# Patient Record
Sex: Female | Born: 1970 | Race: Black or African American | Hispanic: No | Marital: Single | Smoking: Current every day smoker
Health system: Southern US, Community
[De-identification: ages and names within clinical notes are randomized; demographics above are authoritative.]

## PROBLEM LIST (undated history)

## (undated) DIAGNOSIS — D571 Sickle-cell disease without crisis: Secondary | ICD-10-CM

## (undated) DIAGNOSIS — E119 Type 2 diabetes mellitus without complications: Secondary | ICD-10-CM

---

## 2016-12-04 ENCOUNTER — Encounter: Payer: Self-pay | Admitting: Emergency Medicine

## 2016-12-04 ENCOUNTER — Emergency Department
Admission: EM | Admit: 2016-12-04 | Discharge: 2016-12-04 | Disposition: A | Discharge status: Home / Self Care | Payer: Medicaid Other | Attending: Emergency Medicine | Admitting: Emergency Medicine

## 2016-12-04 DIAGNOSIS — L02411 Cutaneous abscess of right axilla: Secondary | ICD-10-CM

## 2016-12-04 DIAGNOSIS — Z4800 Encounter for change or removal of nonsurgical wound dressing: Secondary | ICD-10-CM | POA: Diagnosis not present

## 2016-12-04 DIAGNOSIS — E119 Type 2 diabetes mellitus without complications: Secondary | ICD-10-CM | POA: Diagnosis not present

## 2016-12-04 DIAGNOSIS — F17200 Nicotine dependence, unspecified, uncomplicated: Secondary | ICD-10-CM | POA: Insufficient documentation

## 2016-12-04 HISTORY — DX: Type 2 diabetes mellitus without complications: E11.9

## 2016-12-04 MED ORDER — CLINDAMYCIN HCL 300 MG PO CAPS: 300.0000 mg | ORAL_CAPSULE | Freq: Three times a day (TID) | ORAL | 0 refills | Status: AC

## 2016-12-04 MED ORDER — NAPROXEN 500 MG PO TABS: 500.0000 mg | ORAL_TABLET | Freq: Two times a day (BID) | ORAL | 0 refills | Status: AC

## 2016-12-04 NOTE — Discharge Instructions (Signed)
Follow up with [facility] for symptoms that are not improving over the next 3 days. Return to the ER for symptoms that change or worsen if unable to schedule an appointment.

## 2016-12-04 NOTE — ED Triage Notes (Signed)
States she had an abscess lanced under right arm  States area is still draining and painful

## 2016-12-04 NOTE — ED Provider Notes (Signed)
[facility] Emergency Department Provider Note  ____________________________________________  Time seen: Approximately 3:46 PM  I have reviewed the triage vital signs and the nursing notes.   HISTORY  Chief Complaint Abscess   HPI Diana Harvey is a 46 y.o. female who presents to the emergency department for evaluation of abscess under right arm. Symptoms started 2 weeks ago. I&D at another facility. She finished a 10 day course of antibiotics. Area remains painful and continues to drain. She had stopped taking her diabetes medication, which she contributes to the onset of abscess. She has since restarted the medication.   Past Medical History:  Diagnosis Date  . Diabetes mellitus without complication (HCC)     There are no active problems to display for this patient.   History reviewed. No pertinent surgical history.  Prior to Admission medications   Medication Sig Start Date End Date Taking? Authorizing Provider  clindamycin (CLEOCIN) 300 MG capsule Take 1 capsule (300 mg total) by mouth 3 (three) times daily. 12/04/16 12/14/16  Chinita Pesterari B Moo Gravley, FNP  naproxen (NAPROSYN) 500 MG tablet Take 1 tablet (500 mg total) by mouth 2 (two) times daily with a meal. 12/04/16   Chinita Pesterari B Issis Lindseth, FNP    Allergies Patient has no known allergies.  No family history on file.  Social History Social History  Substance Use Topics  . Smoking status: Current Every Day Smoker  . Smokeless tobacco: Never Used  . Alcohol use No    Review of Systems  Constitutional: negative for fever/chills Respiratory: negative for shortness of breath. Musculoskeletal: negative for pain. Skin: positive for abscess Neurological: Negative for headaches, focal weakness or numbness. ____________________________________________   PHYSICAL EXAM:  VITAL SIGNS: ED Triage Vitals [12/04/16 1538]  Enc Vitals Group     BP 109/70     Pulse Rate (!) 109     Resp 16     Temp 98 F (36.7  C)     Temp Source Oral     SpO2 94 %     Weight 220 lb (99.8 kg)     Height 5\' 7"  (1.702 m)     Head Circumference      Peak Flow      Pain Score      Pain Loc      Pain Edu?      Excl. in GC?      Constitutional: Alert and oriented. Well appearing and in no acute distress. Eyes: Conjunctivae are normal. EOMI. Nose: No congestion/rhinnorhea. Mouth/Throat: Mucous membranes are moist.   Neck: No stridor. Lymphatic: No cervical lymphadenopathy. Cardiovascular: Good peripheral circulation. Respiratory: Normal respiratory effort.  No retractions. Lungs clear to auscultation. Musculoskeletal: FROM throughout. Neurologic:  Normal speech and language. No gross focal neurologic deficits are appreciated. Skin: Right axilla with tegaderm and iodiform gauze in place from I&D about 10 days ago. Skin appears without induration or erythema. After removal of packing, return of bright red blood noted without purulent drainage. No fluctuance noted. No lymphangitis.  ____________________________________________   LABS (all labs ordered are listed, but only abnormal results are displayed)  Labs Reviewed - No data to display ____________________________________________  EKG   ____________________________________________  RADIOLOGY   ____________________________________________   PROCEDURES  Procedure(s) performed: Packing removal from right axilla. ____________________________________________   INITIAL IMPRESSION / ASSESSMENT AND PLAN / ED COURSE     Pertinent labs & imaging results that were available during my care of the patient were reviewed by me and considered in my medical  decision making (see chart for details).  46 year old female presenting to the emergency department for evaluation of abscess to the right axilla that was I&d 10 days ago. While here, the packing was removed. No induration or fluctuance present after packing was removed. She will be treated with  clindamycin as she states that the pain continues. She was instructed to follow-up with her primary care provider for symptoms that are not improving over the next 2-3 days. She was instructed to return to the emergency department for symptoms that change or worsen, specifically lymphangitis.  ____________________________________________   FINAL CLINICAL IMPRESSION(S) / ED DIAGNOSES  Final diagnoses:  Abscess of axilla, right    New Prescriptions   CLINDAMYCIN (CLEOCIN) 300 MG CAPSULE    Take 1 capsule (300 mg total) by mouth 3 (three) times daily.   NAPROXEN (NAPROSYN) 500 MG TABLET    Take 1 tablet (500 mg total) by mouth 2 (two) times daily with a meal.    Note:  This document was prepared using Dragon voice recognition software and may include unintentional dictation errors.    Chinita Pester, FNP 12/04/16 1608    Emily Filbert, MD 12/04/16 [phone]

## 2017-03-26 ENCOUNTER — Other Ambulatory Visit: Payer: Self-pay | Admitting: Family Medicine

## 2017-03-27 ENCOUNTER — Other Ambulatory Visit: Payer: Self-pay | Admitting: Family Medicine

## 2017-03-27 DIAGNOSIS — Z1231 Encounter for screening mammogram for malignant neoplasm of breast: Secondary | ICD-10-CM

## 2017-03-30 ENCOUNTER — Other Ambulatory Visit: Payer: Self-pay | Admitting: Family Medicine

## 2017-03-30 DIAGNOSIS — Z1231 Encounter for screening mammogram for malignant neoplasm of breast: Secondary | ICD-10-CM

## 2017-04-10 ENCOUNTER — Ambulatory Visit
Admission: RE | Admit: 2017-04-10 | Discharge: 2017-04-10 | Disposition: A | Discharge status: Home / Self Care | Payer: Medicaid Other | Source: Ambulatory Visit | Attending: Family Medicine | Admitting: Family Medicine

## 2017-04-10 DIAGNOSIS — Z1231 Encounter for screening mammogram for malignant neoplasm of breast: Secondary | ICD-10-CM | POA: Diagnosis not present

## 2018-11-30 ENCOUNTER — Emergency Department: Payer: Medicaid Other

## 2018-11-30 ENCOUNTER — Encounter: Payer: Self-pay | Admitting: Emergency Medicine

## 2018-11-30 ENCOUNTER — Inpatient Hospital Stay
Admission: EM | Admit: 2018-11-30 | Discharge: 2018-12-03 | DRG: 163 | Disposition: A | Discharge status: Home / Self Care | Payer: Medicaid Other | Attending: Specialist | Admitting: Specialist

## 2018-11-30 ENCOUNTER — Other Ambulatory Visit (INDEPENDENT_AMBULATORY_CARE_PROVIDER_SITE_OTHER): Payer: Self-pay | Admitting: Vascular Surgery

## 2018-11-30 ENCOUNTER — Other Ambulatory Visit: Payer: Self-pay

## 2018-11-30 DIAGNOSIS — T40605A Adverse effect of unspecified narcotics, initial encounter: Secondary | ICD-10-CM | POA: Diagnosis not present

## 2018-11-30 DIAGNOSIS — I82413 Acute embolism and thrombosis of femoral vein, bilateral: Secondary | ICD-10-CM | POA: Diagnosis present

## 2018-11-30 DIAGNOSIS — D571 Sickle-cell disease without crisis: Secondary | ICD-10-CM | POA: Diagnosis present

## 2018-11-30 DIAGNOSIS — Z7984 Long term (current) use of oral hypoglycemic drugs: Secondary | ICD-10-CM | POA: Diagnosis not present

## 2018-11-30 DIAGNOSIS — Z23 Encounter for immunization: Secondary | ICD-10-CM | POA: Diagnosis not present

## 2018-11-30 DIAGNOSIS — E785 Hyperlipidemia, unspecified: Secondary | ICD-10-CM | POA: Diagnosis present

## 2018-11-30 DIAGNOSIS — Z7982 Long term (current) use of aspirin: Secondary | ICD-10-CM

## 2018-11-30 DIAGNOSIS — I519 Heart disease, unspecified: Secondary | ICD-10-CM | POA: Diagnosis not present

## 2018-11-30 DIAGNOSIS — F17200 Nicotine dependence, unspecified, uncomplicated: Secondary | ICD-10-CM | POA: Diagnosis present

## 2018-11-30 DIAGNOSIS — R079 Chest pain, unspecified: Secondary | ICD-10-CM | POA: Diagnosis present

## 2018-11-30 DIAGNOSIS — E119 Type 2 diabetes mellitus without complications: Secondary | ICD-10-CM

## 2018-11-30 DIAGNOSIS — F329 Major depressive disorder, single episode, unspecified: Secondary | ICD-10-CM | POA: Diagnosis present

## 2018-11-30 DIAGNOSIS — G92 Toxic encephalopathy: Secondary | ICD-10-CM | POA: Diagnosis not present

## 2018-11-30 DIAGNOSIS — E876 Hypokalemia: Secondary | ICD-10-CM | POA: Diagnosis present

## 2018-11-30 DIAGNOSIS — D649 Anemia, unspecified: Secondary | ICD-10-CM | POA: Diagnosis not present

## 2018-11-30 DIAGNOSIS — I2699 Other pulmonary embolism without acute cor pulmonale: Secondary | ICD-10-CM | POA: Diagnosis present

## 2018-11-30 DIAGNOSIS — R0902 Hypoxemia: Secondary | ICD-10-CM | POA: Diagnosis not present

## 2018-11-30 DIAGNOSIS — R5081 Fever presenting with conditions classified elsewhere: Secondary | ICD-10-CM | POA: Diagnosis not present

## 2018-11-30 DIAGNOSIS — Z9889 Other specified postprocedural states: Secondary | ICD-10-CM | POA: Diagnosis not present

## 2018-11-30 HISTORY — DX: Sickle-cell disease without crisis: D57.1

## 2018-11-30 LAB — INFLUENZA PANEL BY PCR (TYPE A & B)
Influenza A By PCR: NEGATIVE
Influenza B By PCR: NEGATIVE

## 2018-11-30 LAB — CBC
HEMATOCRIT: 41.9 % (ref 36.0–46.0)
Hemoglobin: 13.7 g/dL (ref 12.0–15.0)
MCH: 27.9 pg (ref 26.0–34.0)
MCHC: 32.7 g/dL (ref 30.0–36.0)
MCV: 85.3 fL (ref 80.0–100.0)
Platelets: 218 10*3/uL (ref 150–400)
RBC: 4.91 MIL/uL (ref 3.87–5.11)
RDW: 12.8 % (ref 11.5–15.5)
WBC: 13.5 10*3/uL — AB (ref 4.0–10.5)
nRBC: 0 % (ref 0.0–0.2)

## 2018-11-30 LAB — BASIC METABOLIC PANEL
Anion gap: 10 (ref 5–15)
BUN: 11 mg/dL (ref 6–20)
CHLORIDE: 97 mmol/L — AB (ref 98–111)
CO2: 29 mmol/L (ref 22–32)
Calcium: 9.5 mg/dL (ref 8.9–10.3)
Creatinine, Ser: 0.93 mg/dL (ref 0.44–1.00)
GFR calc Af Amer: >60 mL/min (ref >60)
GFR calc non Af Amer: >60 mL/min (ref >60)
Glucose, Bld: 164 mg/dL — ABNORMAL HIGH (ref 70–99)
Potassium: 3.1 mmol/L — ABNORMAL LOW (ref 3.5–5.1)
SODIUM: 136 mmol/L (ref 135–145)

## 2018-11-30 LAB — HEPATIC FUNCTION PANEL
ALBUMIN: 4.2 g/dL (ref 3.5–5.0)
ALK PHOS: 70 U/L (ref 38–126)
ALT: 10 U/L (ref 0–44)
AST: 12 U/L — ABNORMAL LOW (ref 15–41)
BILIRUBIN INDIRECT: 0.7 mg/dL (ref 0.3–0.9)
Bilirubin, Direct: 0.1 mg/dL (ref 0.0–0.2)
TOTAL PROTEIN: 8.6 g/dL — AB (ref 6.5–8.1)
Total Bilirubin: 0.8 mg/dL (ref 0.3–1.2)

## 2018-11-30 LAB — TROPONIN I: Troponin I: <0.03 ng/mL (ref <0.03)

## 2018-11-30 LAB — GLUCOSE, CAPILLARY: GLUCOSE-CAPILLARY: 159 mg/dL — AB (ref 70–99)

## 2018-11-30 LAB — PROTIME-INR
INR: 1.06
PROTHROMBIN TIME: 13.7 s (ref 11.4–15.2)

## 2018-11-30 LAB — HEPARIN LEVEL (UNFRACTIONATED): Heparin Unfractionated: 0.71 IU/mL — ABNORMAL HIGH (ref 0.30–0.70)

## 2018-11-30 LAB — LIPASE, BLOOD: Lipase: 27 U/L (ref 11–51)

## 2018-11-30 LAB — APTT: aPTT: 24 seconds (ref 24–36)

## 2018-11-30 MED ORDER — POLYETHYLENE GLYCOL 3350 17 G PO PACK
17.0000 g | PACK | Freq: Every day | ORAL | Status: DC | PRN
  Administered 2018-12-02: 17 g via ORAL
  Filled 2018-11-30: qty 1

## 2018-11-30 MED ORDER — DULOXETINE HCL 30 MG PO CPEP
60.0000 mg | ORAL_CAPSULE | Freq: Every evening | ORAL | Status: DC
  Administered 2018-11-30 – 2018-12-02 (×3): 60 mg via ORAL
  Filled 2018-11-30: qty 2
  Filled 2018-11-30 (×2): qty 1

## 2018-11-30 MED ORDER — OXYCODONE HCL 5 MG PO TABS
5.0000 mg | ORAL_TABLET | ORAL | Status: DC | PRN
  Administered 2018-12-01 – 2018-12-02 (×4): 5 mg via ORAL
  Filled 2018-11-30 (×4): qty 1

## 2018-11-30 MED ORDER — INSULIN ASPART 100 UNIT/ML ~~LOC~~ SOLN
0.0000 [IU] | Freq: Three times a day (TID) | SUBCUTANEOUS | Status: DC
  Administered 2018-12-01: 1 [IU] via SUBCUTANEOUS
  Administered 2018-12-02: 3 [IU] via SUBCUTANEOUS
  Administered 2018-12-02 (×2): 2 [IU] via SUBCUTANEOUS
  Administered 2018-12-03: 1 [IU] via SUBCUTANEOUS
  Administered 2018-12-03: 2 [IU] via SUBCUTANEOUS
  Administered 2018-12-03: 3 [IU] via SUBCUTANEOUS
  Filled 2018-11-30 (×7): qty 1

## 2018-11-30 MED ORDER — INFLUENZA VAC SPLIT QUAD 0.5 ML IM SUSY
0.5000 mL | PREFILLED_SYRINGE | INTRAMUSCULAR | Status: AC
  Administered 2018-12-03: 0.5 mL via INTRAMUSCULAR
  Filled 2018-11-30: qty 0.5

## 2018-11-30 MED ORDER — ZIPRASIDONE HCL 80 MG PO CAPS
80.0000 mg | ORAL_CAPSULE | Freq: Every day | ORAL | Status: DC
  Administered 2018-11-30 – 2018-12-02 (×3): 80 mg via ORAL
  Filled 2018-11-30 (×4): qty 1

## 2018-11-30 MED ORDER — KETOROLAC TROMETHAMINE 30 MG/ML IJ SOLN
30.0000 mg | Freq: Four times a day (QID) | INTRAMUSCULAR | Status: DC | PRN
  Administered 2018-11-30 – 2018-12-01 (×2): 30 mg via INTRAVENOUS
  Filled 2018-11-30 (×3): qty 1

## 2018-11-30 MED ORDER — ONDANSETRON HCL 4 MG/2ML IJ SOLN: 4.0000 mg | Freq: Four times a day (QID) | INTRAMUSCULAR | Status: DC | PRN

## 2018-11-30 MED ORDER — SODIUM CHLORIDE 0.9% FLUSH: 3.0000 mL | Freq: Once | INTRAVENOUS | Status: DC

## 2018-11-30 MED ORDER — ACETAMINOPHEN 325 MG PO TABS
650.0000 mg | ORAL_TABLET | Freq: Four times a day (QID) | ORAL | Status: DC | PRN
  Administered 2018-12-02: 650 mg via ORAL
  Filled 2018-11-30: qty 2

## 2018-11-30 MED ORDER — ZOLPIDEM TARTRATE 5 MG PO TABS
5.0000 mg | ORAL_TABLET | Freq: Every evening | ORAL | Status: DC | PRN
  Administered 2018-11-30 – 2018-12-02 (×3): 5 mg via ORAL
  Filled 2018-11-30 (×3): qty 1

## 2018-11-30 MED ORDER — IOHEXOL 350 MG/ML SOLN
75.0000 mL | Freq: Once | INTRAVENOUS | Status: AC | PRN
  Administered 2018-11-30: 75 mL via INTRAVENOUS

## 2018-11-30 MED ORDER — PNEUMOCOCCAL VAC POLYVALENT 25 MCG/0.5ML IJ INJ
0.5000 mL | INJECTION | INTRAMUSCULAR | Status: AC
  Administered 2018-12-03: 0.5 mL via INTRAMUSCULAR
  Filled 2018-11-30: qty 0.5

## 2018-11-30 MED ORDER — SODIUM CHLORIDE 0.9 % IV SOLN
INTRAVENOUS | Status: DC
  Administered 2018-12-01 (×2): via INTRAVENOUS

## 2018-11-30 MED ORDER — HEPARIN (PORCINE) 25000 UT/250ML-% IV SOLN
1200.0000 [IU]/h | INTRAVENOUS | Status: DC
  Administered 2018-11-30: 1300 [IU]/h via INTRAVENOUS
  Administered 2018-12-01: 1200 [IU]/h via INTRAVENOUS
  Filled 2018-11-30 (×2): qty 250

## 2018-11-30 MED ORDER — HEPARIN BOLUS VIA INFUSION
4700.0000 [IU] | Freq: Once | INTRAVENOUS | Status: AC
  Administered 2018-11-30: 4700 [IU] via INTRAVENOUS
  Filled 2018-11-30: qty 4700

## 2018-11-30 MED ORDER — FENTANYL CITRATE (PF) 100 MCG/2ML IJ SOLN
50.0000 ug | Freq: Once | INTRAMUSCULAR | Status: AC
  Administered 2018-11-30: 50 ug via INTRAVENOUS
  Filled 2018-11-30: qty 2

## 2018-11-30 MED ORDER — ACETAMINOPHEN 650 MG RE SUPP: 650.0000 mg | Freq: Four times a day (QID) | RECTAL | Status: DC | PRN

## 2018-11-30 MED ORDER — CEFAZOLIN SODIUM-DEXTROSE 2-4 GM/100ML-% IV SOLN
2.0000 g | Freq: Once | INTRAVENOUS | Status: AC
  Administered 2018-12-01: 2 g via INTRAVENOUS
  Filled 2018-11-30: qty 100

## 2018-11-30 MED ORDER — SIMVASTATIN 10 MG PO TABS
40.0000 mg | ORAL_TABLET | Freq: Every day | ORAL | Status: DC
  Administered 2018-12-01: 40 mg via ORAL
  Filled 2018-11-30: qty 4

## 2018-11-30 MED ORDER — ONDANSETRON HCL 4 MG PO TABS: 4.0000 mg | ORAL_TABLET | Freq: Four times a day (QID) | ORAL | Status: DC | PRN

## 2018-11-30 NOTE — Consult Note (Signed)
ANTICOAGULATION CONSULT NOTE - Initial Consult  Pharmacy Consult for Heparin Infusion Indication: pulmonary embolus  No Known Allergies  Patient Measurements: Height: 5\' 7"  (170.2 cm) Weight: 180 lb 3.2 oz (81.7 kg) IBW/kg (Calculated) : 61.6 Heparin Dosing Weight: 78.8  Vital Signs: Temp: 98.1 F (36.7 C) (02/11 1935) Temp Source: Oral (02/11 1935) BP: 111/64 (02/11 1935) Pulse Rate: 86 (02/11 1935)  Labs: Recent Labs    11/30/18 1129 11/30/18 1950  HGB 13.7  --   HCT 41.9  --   PLT 218  --   APTT 24  --   LABPROT 13.7  --   INR 1.06  --   HEPARINUNFRC  --  0.71*  CREATININE 0.93  --   TROPONINI <0.03  --     Estimated Creatinine Clearance: 81.3 mL/min (by C-G formula based on SCr of 0.93 mg/dL).   Medical History: Past Medical History:  Diagnosis Date  . Diabetes mellitus without complication (HCC)   . Sickle cell anemia (HCC)     Medications:  Medications Prior to Admission  Medication Sig Dispense Refill Last Dose  . aspirin EC 81 MG tablet Take 81 mg by mouth daily.   7+ days at Unknown  . canagliflozin (INVOKANA) 100 MG TABS tablet Take 100 mg by mouth daily before breakfast.   11/30/2018 at 0800  . DULoxetine (CYMBALTA) 60 MG capsule Take 60 mg by mouth every evening.    11/29/2018 at 1900  . simvastatin (ZOCOR) 40 MG tablet Take 40 mg by mouth daily.   11/30/2018 at 0800  . ziprasidone (GEODON) 80 MG capsule Take 80 mg by mouth at bedtime.   11/29/2018 at 2000  . zolpidem (AMBIEN) 10 MG tablet Take 10 mg by mouth at bedtime as needed for sleep.   Unknown at PRN  . naproxen (NAPROSYN) 500 MG tablet Take 1 tablet (500 mg total) by mouth 2 (two) times daily with a meal. 30 tablet 0     Assessment: 48 yo female with confirmed PE.  Pharmacy has been consulted to dose heparin infusion.  Patient has no history of prior anticoagulation PTA.  Goal of Therapy:  Heparin level 0.3-0.7 units/ml Monitor platelets by anticoagulation protocol: Yes   Plan:  2/11 @  1950 Heparin level - 0.71 and slightly elevated above up limit of goal range. Will decrease rate to heparin 1200 units/hr Check heparin level every 6 hours until two consecutive therapeutic levels then daily and CBC daily while on heparin Continue to monitor H&H and platelets  Orinda Kenner, PharmD Clinical Pharmacist 11/30/2018,8:15 PM

## 2018-11-30 NOTE — Consult Note (Signed)
[facility] Vascular Consult Note  MRN : [id number]  Diana Harvey is a 48 y.o. (1971-03-05) female who presents with chief complaint of  Chief Complaint  Patient presents with  . Chest Pain   History of Present Illness:  The patient is a 48 year old female with a past medical history of sickle cell anemia, diabetes mellitus, active tobacco abuse who presented with increasing shortness of breath and chest pain for approximately 2 to 3 days.  The patient was initially seen at the [facility] clinic and after being examined was sent to the emergency room where she was found to be tachycardic and  tachypneic.   The patient denies any recent surgery or trauma.  Denies any prolonged sickness/immobility.  Denies any history of bleeding/clotting disorders.  Patient denies any bilateral lower extremity pain or swelling.  Patient denies being on any type of blood thinner.  Patient denies any past medical history of DVT or PE. Denies any fever, nausea vomiting.  During her work-up in the emergency department the patient was found to have large bilateral pulmonary emboli right larger when compared to the left without right heart strain on CT. suspected right femoral vein thrombosis with possible left femoral vein thrombosis.  An ultrasound conducted after the CT was notable for no evidence of deep venous thrombosis bilaterally.  Vascular surgery was consulted by Dr. Allena Katz for further recommendations.  Current Facility-Administered Medications  Medication Dose Route Frequency Provider Last Rate Last Dose  . heparin ADULT infusion 100 units/mL (25000 units/245mL sodium chloride 0.45%)  1,300 Units/hr Intravenous Continuous Little Ishikawa, RPH 13 mL/hr at 11/30/18 1356 1,300 Units/hr at 11/30/18 1356  . ketorolac (TORADOL) 30 MG/ML injection 30 mg  30 mg Intravenous Q6H PRN Enedina Finner, MD   30 mg at 11/30/18 1618  . oxyCODONE (Oxy IR/ROXICODONE) immediate release tablet 5  mg  5 mg Oral Q4H PRN Enedina Finner, MD       Current Outpatient Medications  Medication Sig Dispense Refill  . aspirin EC 81 MG tablet Take 81 mg by mouth daily.    . canagliflozin (INVOKANA) 100 MG TABS tablet Take 100 mg by mouth daily before breakfast.    . DULoxetine (CYMBALTA) 60 MG capsule Take 60 mg by mouth every evening.     . simvastatin (ZOCOR) 40 MG tablet Take 40 mg by mouth daily.    . ziprasidone (GEODON) 80 MG capsule Take 80 mg by mouth at bedtime.    Marland Kitchen zolpidem (AMBIEN) 10 MG tablet Take 10 mg by mouth at bedtime as needed for sleep.    . naproxen (NAPROSYN) 500 MG tablet Take 1 tablet (500 mg total) by mouth 2 (two) times daily with a meal. 30 tablet 0   Past Medical History:  Diagnosis Date  . Diabetes mellitus without complication (HCC)   . Sickle cell anemia (HCC)    History reviewed. No pertinent surgical history.  Social History Social History   Tobacco Use  . Smoking status: Current Every Day Smoker  . Smokeless tobacco: Never Used  Substance Use Topics  . Alcohol use: No  . Drug use: Not Currently   Family History No family history on file.  Patient denies any family history of peripheral artery disease, venous disease or bleeding/clotting disorder  No Known Allergies  REVIEW OF SYSTEMS (Negative unless checked)  Constitutional: [] Weight loss  [] Fever  [] Chills Cardiac: [x] Chest pain   [x] Chest pressure   [x] Palpitations   [x] Shortness of breath when laying flat   [  x]Shortness of breath at rest   [x] Shortness of breath with exertion. Vascular:  [] Pain in legs with walking   [] Pain in legs at rest   [] Pain in legs when laying flat   [] Claudication   [] Pain in feet when walking  [] Pain in feet at rest  [] Pain in feet when laying flat   [] History of DVT   [] Phlebitis   [] Swelling in legs   [] Varicose veins   [] Non-healing ulcers Pulmonary:   [] Uses home oxygen   [] Productive cough   [] Hemoptysis   [] Wheeze  [] COPD   [] Asthma Neurologic:  [] Dizziness   [] Blackouts   [] Seizures   [] History of stroke   [] History of TIA  [] Aphasia   [] Temporary blindness   [] Dysphagia   [] Weakness or numbness in arms   [] Weakness or numbness in legs Musculoskeletal:  [] Arthritis   [] Joint swelling   [] Joint pain   [] Low back pain Hematologic:  [] Easy bruising  [] Easy bleeding   [] Hypercoagulable state   [] Anemic  [] Hepatitis Gastrointestinal:  [] Blood in stool   [] Vomiting blood  [] Gastroesophageal reflux/heartburn   [] Difficulty swallowing. Genitourinary:  [] Chronic kidney disease   [] Difficult urination  [] Frequent urination  [] Burning with urination   [] Blood in urine Skin:  [] Rashes   [] Ulcers   [] Wounds Psychological:  [] History of anxiety   []  History of major depression.  Physical Examination  Vitals:   11/30/18 1327 11/30/18 1352 11/30/18 1530 11/30/18 1600  BP:  127/87 120/67 124/74  Pulse:  (!) 114 90   Resp:  20 (!) 36 (!) 30  Temp:      TempSrc:      SpO2:  (!) 84% 98%   Weight: 83 kg     Height: 5\' 7"  (1.702 m)      Body mass index is 28.66 kg/m. Gen:  WD/WN, NAD Head: [place]/AT, No temporalis wasting. Prominent temp pulse not noted. Ear/Nose/Throat: Hearing grossly intact, nares w/o erythema or drainage, oropharynx w/o Erythema/Exudate Eyes: Sclera non-icteric, conjunctiva clear Neck: Trachea midline.  No JVD.  Pulmonary:  Good air movement, respirations not labored, equal bilaterally.  Cardiac: RRR, normal S1, S2. Vascular:  Vessel Right Left  Radial Palpable Palpable  Ulnar Palpable Palpable  Brachial Palpable Palpable  Carotid Palpable, without bruit Palpable, without bruit  Aorta Not palpable N/A  Femoral Palpable Palpable  Popliteal Palpable Palpable  PT Palpable Palpable  DP Palpable Palpable   Right Lower Extremity: Thigh soft, calf soft.  Tender to palpation.  Minimal edema noted.  Extremity is warm distally in toes.  Skin is intact.  There is no cellulitis. Left Lower Extremity: Thigh soft, calf soft.  Tender to palpation.   Minimal edema noted.  Extremity is warm distally in toes.  Skin is intact.  There is no cellulitis.  Gastrointestinal: soft, non-tender/non-distended. No guarding/reflex.  Musculoskeletal: M/S 5/5 throughout.  Extremities without ischemic changes.  No deformity or atrophy.  Neurologic: Sensation grossly intact in extremities.  Symmetrical.  Speech is fluent. Motor exam as listed above. Psychiatric: Judgment intact, Mood & affect appropriate for pt's clinical situation. Dermatologic: No rashes or ulcers noted.  No cellulitis or open wounds. Lymph : No Cervical, Axillary, or Inguinal lymphadenopathy.  CBC Lab Results  Component Value Date   WBC 13.5 (H) 11/30/2018   HGB 13.7 11/30/2018   HCT 41.9 11/30/2018   MCV 85.3 11/30/2018   PLT 218 11/30/2018   BMET    Component Value Date/Time   NA 136 11/30/2018 1129   K 3.1 (L) 11/30/2018  1129   CL 97 (L) 11/30/2018 1129   CO2 29 11/30/2018 1129   GLUCOSE 164 (H) 11/30/2018 1129   BUN 11 11/30/2018 1129   CREATININE 0.93 11/30/2018 1129   CALCIUM 9.5 11/30/2018 1129   GFRNONAA >60 11/30/2018 1129   GFRAA >60 11/30/2018 1129   Estimated Creatinine Clearance: 82 mL/min (by C-G formula based on SCr of 0.93 mg/dL).  COAG Lab Results  Component Value Date   INR 1.06 11/30/2018   Radiology Dg Chest 2 View  Result Date: 11/30/2018 CLINICAL DATA:  Chest pain and shortness of breath for 2 days. Sickle cell anemia. Smoker. EXAM: CHEST - 2 VIEW COMPARISON:  None. FINDINGS: Heart size is within normal limits. Elevation of right hemidiaphragm seen. Atelectasis or infiltrate is seen in both lung bases, right side greater than left. IMPRESSION: Bibasilar atelectasis versus infiltrates, right side greater than left. Electronically Signed   By: Myles Rosenthal M.D.   On: 11/30/2018 12:20   Ct Angio Chest Pe W And/or Wo Contrast  Result Date: 11/30/2018 CLINICAL DATA:  Pt states increasing chest pain and SOB. Pt also states RUQ pain worsening over  the last day. EXAM: CT ANGIOGRAPHY CHEST CT ABDOMEN AND PELVIS WITH CONTRAST TECHNIQUE: Multidetector CT imaging of the chest was performed using the standard protocol during bolus administration of intravenous contrast. Multiplanar CT image reconstructions and MIPs were obtained to evaluate the vascular anatomy. Multidetector CT imaging of the abdomen and pelvis was performed using the standard protocol during bolus administration of intravenous contrast. CONTRAST:  75mL OMNIPAQUE IOHEXOL 350 MG/ML SOLN COMPARISON:  11/30/2018 FINDINGS: CTA CHEST FINDINGS Cardiovascular: There are large pulmonary emboli involving the RIGHT main pulmonary artery extending into the UPPER and LOWER lobe arteries. Significant LEFT pulmonary embolus involves the LOWER lobe arteries and extends into LOWER lobe branches. No evidence for RIGHT heart strain. Incidental note is made of LEFT common carotid originating from the RIGHT brachiocephalic artery. Otherwise the thoracic aorta is normal. Mediastinum/Nodes: Small hiatal hernia. Esophagus other wise is normal. Thyroid is normal in appearance. No significant adenopathy. Lungs/Pleura: There is bibasilar atelectasis. Consolidation at the RIGHT LOWER lobe may represent infarct. No suspicious pulmonary nodules. Musculoskeletal: No chest wall abnormality. No acute or significant osseous findings. Review of the MIP images confirms the above findings. CT ABDOMEN and PELVIS FINDINGS Hepatobiliary: No focal liver abnormality is seen. No radiopaque gallstones, biliary dilatation, or pericholecystic inflammatory changes. Pancreas: Unremarkable. No pancreatic ductal dilatation or surrounding inflammatory changes. Spleen: Normal in size without focal abnormality. Adrenals/Urinary Tract: Adrenal glands are normal in appearance. Symmetric enhancement and excretion from both kidneys. No renal mass. Ureters are unremarkable. The bladder and visualized portion of the urethra are normal. Stomach/Bowel:  Small hiatal hernia. Small bowel loops are normal in appearance. The appendix is well seen and is normal in caliber. Tiny appendicolith without evidence for acute appendicitis. Large bowel is unremarkable. Vascular/Lymphatic: The RIGHT common femoral artery is expanded and low attenuation, suspicious for deep vein thrombosis. There is slight heterogeneous appearance of the LEFT common femoral vein, possibly related to in flow. Consider thrombosis. No retroperitoneal or mesenteric adenopathy. Reproductive: The uterus is present. No adnexal mass. No free pelvic fluid. Other: No abdominal wall hernia or abnormality. No abdominopelvic ascites. Musculoskeletal: No acute or significant osseous findings. Review of the MIP images confirms the above findings. IMPRESSION: 1. Significant pulmonary emboli bilaterally. There is no evidence for RIGHT heart strain. 2. Bibasilar atelectasis; suspect consolidation or infarct in the RIGHT LOWER lobe. 3. Small  hiatal hernia. 4. Normal appendix. 5. Suspect RIGHT femoral vein thrombosis. Possible LEFT femoral vein thrombosis. Recommend further evaluation with bilateral LOWER extremity Doppler exams. Critical Value/emergent results were called by telephone at the time of interpretation on 11/30/2018 at 1:10 pm to Dr. Ileana RoupJAMES MCSHANE , who verbally acknowledged these results. Electronically Signed   By: Norva PavlovElizabeth  Brown M.D.   On: 11/30/2018 13:28   Ct Abdomen Pelvis W Contrast  Result Date: 11/30/2018 CLINICAL DATA:  Pt states increasing chest pain and SOB. Pt also states RUQ pain worsening over the last day. EXAM: CT ANGIOGRAPHY CHEST CT ABDOMEN AND PELVIS WITH CONTRAST TECHNIQUE: Multidetector CT imaging of the chest was performed using the standard protocol during bolus administration of intravenous contrast. Multiplanar CT image reconstructions and MIPs were obtained to evaluate the vascular anatomy. Multidetector CT imaging of the abdomen and pelvis was performed using the standard  protocol during bolus administration of intravenous contrast. CONTRAST:  75mL OMNIPAQUE IOHEXOL 350 MG/ML SOLN COMPARISON:  11/30/2018 FINDINGS: CTA CHEST FINDINGS Cardiovascular: There are large pulmonary emboli involving the RIGHT main pulmonary artery extending into the UPPER and LOWER lobe arteries. Significant LEFT pulmonary embolus involves the LOWER lobe arteries and extends into LOWER lobe branches. No evidence for RIGHT heart strain. Incidental note is made of LEFT common carotid originating from the RIGHT brachiocephalic artery. Otherwise the thoracic aorta is normal. Mediastinum/Nodes: Small hiatal hernia. Esophagus other wise is normal. Thyroid is normal in appearance. No significant adenopathy. Lungs/Pleura: There is bibasilar atelectasis. Consolidation at the RIGHT LOWER lobe may represent infarct. No suspicious pulmonary nodules. Musculoskeletal: No chest wall abnormality. No acute or significant osseous findings. Review of the MIP images confirms the above findings. CT ABDOMEN and PELVIS FINDINGS Hepatobiliary: No focal liver abnormality is seen. No radiopaque gallstones, biliary dilatation, or pericholecystic inflammatory changes. Pancreas: Unremarkable. No pancreatic ductal dilatation or surrounding inflammatory changes. Spleen: Normal in size without focal abnormality. Adrenals/Urinary Tract: Adrenal glands are normal in appearance. Symmetric enhancement and excretion from both kidneys. No renal mass. Ureters are unremarkable. The bladder and visualized portion of the urethra are normal. Stomach/Bowel: Small hiatal hernia. Small bowel loops are normal in appearance. The appendix is well seen and is normal in caliber. Tiny appendicolith without evidence for acute appendicitis. Large bowel is unremarkable. Vascular/Lymphatic: The RIGHT common femoral artery is expanded and low attenuation, suspicious for deep vein thrombosis. There is slight heterogeneous appearance of the LEFT common femoral vein,  possibly related to in flow. Consider thrombosis. No retroperitoneal or mesenteric adenopathy. Reproductive: The uterus is present. No adnexal mass. No free pelvic fluid. Other: No abdominal wall hernia or abnormality. No abdominopelvic ascites. Musculoskeletal: No acute or significant osseous findings. Review of the MIP images confirms the above findings. IMPRESSION: 1. Significant pulmonary emboli bilaterally. There is no evidence for RIGHT heart strain. 2. Bibasilar atelectasis; suspect consolidation or infarct in the RIGHT LOWER lobe. 3. Small hiatal hernia. 4. Normal appendix. 5. Suspect RIGHT femoral vein thrombosis. Possible LEFT femoral vein thrombosis. Recommend further evaluation with bilateral LOWER extremity Doppler exams. Critical Value/emergent results were called by telephone at the time of interpretation on 11/30/2018 at 1:10 pm to Dr. Ileana RoupJAMES MCSHANE , who verbally acknowledged these results. Electronically Signed   By: Norva PavlovElizabeth  Brown M.D.   On: 11/30/2018 13:28   [place] Venous Img Lower Bilateral  Result Date: 11/30/2018 CLINICAL DATA:  Acute pulmonary embolus by chest CTA EXAM: BILATERAL LOWER EXTREMITY VENOUS DOPPLER ULTRASOUND TECHNIQUE: Gray-scale sonography with graded compression, as well as  color Doppler and duplex ultrasound were performed to evaluate the lower extremity deep venous systems from the level of the common femoral vein and including the common femoral, femoral, profunda femoral, popliteal and calf veins including the posterior tibial, peroneal and gastrocnemius veins when visible. The superficial great saphenous vein was also interrogated. Spectral Doppler was utilized to evaluate flow at rest and with distal augmentation maneuvers in the common femoral, femoral and popliteal veins. COMPARISON:  None. FINDINGS: RIGHT LOWER EXTREMITY Common Femoral Vein: No evidence of thrombus. Normal compressibility, respiratory phasicity and response to augmentation. Saphenofemoral Junction:  No evidence of thrombus. Normal compressibility and flow on color Doppler imaging. Profunda Femoral Vein: No evidence of thrombus. Normal compressibility and flow on color Doppler imaging. Femoral Vein: No evidence of thrombus. Normal compressibility, respiratory phasicity and response to augmentation. Popliteal Vein: No evidence of thrombus. Normal compressibility, respiratory phasicity and response to augmentation. Calf Veins: No evidence of thrombus. Normal compressibility and flow on color Doppler imaging. Superficial Great Saphenous Vein: No evidence of thrombus. Normal compressibility. Venous Reflux:  None. Other Findings:  None. LEFT LOWER EXTREMITY Common Femoral Vein: No evidence of thrombus. Normal compressibility, respiratory phasicity and response to augmentation. Saphenofemoral Junction: No evidence of thrombus. Normal compressibility and flow on color Doppler imaging. Profunda Femoral Vein: No evidence of thrombus. Normal compressibility and flow on color Doppler imaging. Femoral Vein: No evidence of thrombus. Normal compressibility, respiratory phasicity and response to augmentation. Popliteal Vein: No evidence of thrombus. Normal compressibility, respiratory phasicity and response to augmentation. Calf Veins: No evidence of thrombus. Normal compressibility and flow on color Doppler imaging. Superficial Great Saphenous Vein: No evidence of thrombus. Normal compressibility. Venous Reflux:  None. Other Findings:  None. IMPRESSION: No evidence of deep venous thrombosis. Electronically Signed   By: Judie PetitM.  Shick M.D.   On: 11/30/2018 15:29   Assessment/Plan The patient is a 48 year old female with a past medical history of sickle cell anemia, diabetes mellitus, active tobacco abuse who presented with increasing shortness of breath and chest pain for approximately 2 to 3 days. The patient was found to have large bilateral pulmonary emboli right larger when compared to the left without right heart strain on  CT. Suspected right femoral vein thrombosis with possible left femoral vein thrombosis on CT.  An ultrasound conducted after the CT was notable for no evidence of deep venous thrombosis bilaterally. 1. PE: Patient with large bilateral pulmonary emboli.  Right pulmonary emboli larger when compared to the left.  No heart strain noted on CT.  The patient has been started on a heparin drip in the ED.  The patient continues to have shortness of breath and some chest pain even on heparin.  The patient does show an increased respiratory rate of 25-30.  Due to the size of the patient's bilateral pulmonary emboli, her vitals and her symptoms would recommend a bilateral pulmonary lysis.  Procedure, risks and benefits explained to the patient.  All questions answered.  The patient wishes to proceed.  We will plan on doing this tomorrow with Dr. Gilda CreaseSchnier. 2. Possible suspected bilateral femoral vein thrombosis: Possible bilateral iliofemoral venous lysis with IVC filter placement.  Procedure, risks and benefits explained to the patient all questions answered.  The patient wishes to proceed.  3. Diabetes: Encouraged good control as its slows the progression of atherosclerotic disease  Discussed with Dr. Romie JumperSchnier  Anoop Hemmer A Jaysin Gayler, PA-C  11/30/2018 5:05 PM  This note was created with Dragon medical transcription system.  Any error  is purely unintentional.

## 2018-11-30 NOTE — Consult Note (Signed)
ANTICOAGULATION CONSULT NOTE - Initial Consult  Pharmacy Consult for Heparin Infusion Indication: pulmonary embolus  No Known Allergies  Patient Measurements: Height: 5\' 7"  (170.2 cm) Weight: 183 lb (83 kg) IBW/kg (Calculated) : 61.6 Heparin Dosing Weight: 78.8  Vital Signs: Temp: 98.5 F (36.9 C) (02/11 1127) Temp Source: Oral (02/11 1127) BP: 94/74 (02/11 1127) Pulse Rate: 101 (02/11 1127)  Labs: Recent Labs    11/30/18 1129  HGB 13.7  HCT 41.9  PLT 218  CREATININE 0.93  TROPONINI <0.03    Estimated Creatinine Clearance: 82 mL/min (by C-G formula based on SCr of 0.93 mg/dL).   Medical History: Past Medical History:  Diagnosis Date  . Diabetes mellitus without complication (HCC)   . Sickle cell anemia (HCC)     Medications:  (Not in a hospital admission)   Assessment: 48 yo female with confirmed PE.  Pharmacy has been consulted to dose heparin infusion.  Patient has no history of prior anticoagulation PTA.  Goal of Therapy:  Heparin level 0.3-0.7 units/ml Monitor platelets by anticoagulation protocol: Yes   Plan:  Give 4700 units bolus x 1 Start heparin infusion at 1300 units/hr Check heparin level every 6 hours until two consecutive therapeutic levels then daily and CBC daily while on heparin Continue to monitor H&H and platelets  Orinda Kenner, PharmD Clinical Pharmacist 11/30/2018,1:30 PM

## 2018-11-30 NOTE — ED Triage Notes (Signed)
PT sent via EMS from [facility] with c/o CP and SOB x2days ago. VSS

## 2018-11-30 NOTE — ED Notes (Signed)
Pt's room air saturation 89% pt placed on 2L

## 2018-11-30 NOTE — H&P (Signed)
[facility] [place] [facility]   PATIENT NAME: Diana Harvey    MR#:  [id number]  DATE OF BIRTH:  1971/02/08  DATE OF ADMISSION:  11/30/2018  PRIMARY CARE PHYSICIAN: [facility]   REQUESTING/REFERRING PHYSICIAN: Dr. Alphonzo Lemmings  CHIEF COMPLAINT:  chest pain and increasing shortness of breath along with back pain for three or four days  HISTORY OF PRESENT ILLNESS:  Diana Harvey  is a 48 y.o. female with a known history of sickle cell trait, diabetes, hyperlipidemia comes to the emergency room with increasing shortness of breath and chest pain for 2 to 3 days. She was seen at [facility] and was sent to the emergency room where she was found to be tachycardic. Workup in the ER revealed patient has bilateral large PE right more than left without right heart strain and CT abdomen revealing bilateral femoral vein DVT. Ultrasound Doppler studies are pending.  She denies any recent travel, birth control pills, any history of cancer, history of blood clots, any recent illness.  She started on IV heparin drip. She is being admitted with for further evaluation of management.  Spoke with Dr. Gilda Crease who will see patient in consultation.  PAST MEDICAL HISTORY:   Past Medical History:  Diagnosis Date  . Diabetes mellitus without complication (HCC)   . Sickle cell anemia (HCC)     PAST SURGICAL HISTOIRY:  History reviewed. No pertinent surgical history.  SOCIAL HISTORY:   Social History   Tobacco Use  . Smoking status: Current Every Day Smoker  . Smokeless tobacco: Never Used  Substance Use Topics  . Alcohol use: No    FAMILY HISTORY:  No family history on file.  DRUG ALLERGIES:  No Known Allergies  REVIEW OF SYSTEMS:  Review of Systems  Constitutional: Negative for chills, fever and weight loss.  HENT: Negative for ear discharge, ear pain and nosebleeds.   Eyes: Negative for blurred vision, pain and discharge.   Respiratory: Positive for shortness of breath. Negative for sputum production, wheezing and stridor.   Cardiovascular: Positive for chest pain. Negative for palpitations, orthopnea and PND.  Gastrointestinal: Negative for abdominal pain, diarrhea, nausea and vomiting.  Genitourinary: Negative for frequency and urgency.  Musculoskeletal: Negative for back pain and joint pain.  Neurological: Negative for sensory change, speech change, focal weakness and weakness.  Psychiatric/Behavioral: Negative for depression and hallucinations. The patient is not nervous/anxious.      MEDICATIONS AT HOME:   Prior to Admission medications   Medication Sig Start Date End Date Taking? Authorizing Provider  aspirin EC 81 MG tablet Take 81 mg by mouth daily. 03/20/17  Yes [provider]  canagliflozin (INVOKANA) 100 MG TABS tablet Take 100 mg by mouth daily before breakfast.   Yes [provider]  DULoxetine (CYMBALTA) 60 MG capsule Take 60 mg by mouth every evening.    Yes [provider]  simvastatin (ZOCOR) 40 MG tablet Take 40 mg by mouth daily.   Yes [provider]  ziprasidone (GEODON) 80 MG capsule Take 80 mg by mouth at bedtime. 01/06/18  Yes [provider]  zolpidem (AMBIEN) 10 MG tablet Take 10 mg by mouth at bedtime as needed for sleep. 12/28/17  Yes [provider]  naproxen (NAPROSYN) 500 MG tablet Take 1 tablet (500 mg total) by mouth 2 (two) times daily with a meal. 12/04/16   Triplett, Cari B, FNP      VITAL SIGNS:  Blood pressure 127/87,  pulse (!) 114, temperature 98.5 F (36.9 C), temperature source Oral, resp. rate 20, height 5\' 7"  (1.702 m), weight 83 kg, last menstrual period 11/22/2018, SpO2 (!) 84 %.  PHYSICAL EXAMINATION:  GENERAL:  48 y.o.-year-old patient lying in the bed with no acute distress.  EYES: Pupils equal, round, reactive to light and accommodation. No scleral icterus. Extraocular muscles intact.  HEENT: Head  atraumatic, normocephalic. Oropharynx and nasopharynx clear.  NECK:  Supple, no jugular venous distention. No thyroid enlargement, no tenderness.  LUNGS: Normal breath sounds bilaterally, no wheezing, rales,rhonchi or crepitation. No use of accessory muscles of respiration.  CARDIOVASCULAR: S1, S2 normal. No murmurs, rubs, or gallops. Tachycardia ABDOMEN: Soft, nontender, nondistended. Bowel sounds present. No organomegaly or mass.  EXTREMITIES: No pedal edema, cyanosis, or clubbing.  NEUROLOGIC: Cranial nerves II through XII are intact. Muscle strength 5/5 in all extremities. Sensation intact. Gait not checked.  PSYCHIATRIC: The patient is alert and oriented x 3.  SKIN: No obvious rash, lesion, or ulcer.   LABORATORY PANEL:   CBC Recent Labs  Lab 11/30/18 1129  WBC 13.5*  HGB 13.7  HCT 41.9  PLT 218   ------------------------------------------------------------------------------------------------------------------  Chemistries  Recent Labs  Lab 11/30/18 1129  NA 136  K 3.1*  CL 97*  CO2 29  GLUCOSE 164*  BUN 11  CREATININE 0.93  CALCIUM 9.5  AST 12*  ALT 10  ALKPHOS 70  BILITOT 0.8   ------------------------------------------------------------------------------------------------------------------  Cardiac Enzymes Recent Labs  Lab 11/30/18 1129  TROPONINI <0.03   ------------------------------------------------------------------------------------------------------------------  RADIOLOGY:  Dg Chest 2 View  Result Date: 11/30/2018 CLINICAL DATA:  Chest pain and shortness of breath for 2 days. Sickle cell anemia. Smoker. EXAM: CHEST - 2 VIEW COMPARISON:  None. FINDINGS: Heart size is within normal limits. Elevation of right hemidiaphragm seen. Atelectasis or infiltrate is seen in both lung bases, right side greater than left. IMPRESSION: Bibasilar atelectasis versus infiltrates, right side greater than left. Electronically Signed   By: Myles Rosenthal M.D.   On:  11/30/2018 12:20   Ct Angio Chest Pe W And/or Wo Contrast  Result Date: 11/30/2018 CLINICAL DATA:  Pt states increasing chest pain and SOB. Pt also states RUQ pain worsening over the last day. EXAM: CT ANGIOGRAPHY CHEST CT ABDOMEN AND PELVIS WITH CONTRAST TECHNIQUE: Multidetector CT imaging of the chest was performed using the standard protocol during bolus administration of intravenous contrast. Multiplanar CT image reconstructions and MIPs were obtained to evaluate the vascular anatomy. Multidetector CT imaging of the abdomen and pelvis was performed using the standard protocol during bolus administration of intravenous contrast. CONTRAST:  57mL OMNIPAQUE IOHEXOL 350 MG/ML SOLN COMPARISON:  11/30/2018 FINDINGS: CTA CHEST FINDINGS Cardiovascular: There are large pulmonary emboli involving the RIGHT main pulmonary artery extending into the UPPER and LOWER lobe arteries. Significant LEFT pulmonary embolus involves the LOWER lobe arteries and extends into LOWER lobe branches. No evidence for RIGHT heart strain. Incidental note is made of LEFT common carotid originating from the RIGHT brachiocephalic artery. Otherwise the thoracic aorta is normal. Mediastinum/Nodes: Small hiatal hernia. Esophagus other wise is normal. Thyroid is normal in appearance. No significant adenopathy. Lungs/Pleura: There is bibasilar atelectasis. Consolidation at the RIGHT LOWER lobe may represent infarct. No suspicious pulmonary nodules. Musculoskeletal: No chest wall abnormality. No acute or significant osseous findings. Review of the MIP images confirms the above findings. CT ABDOMEN and PELVIS FINDINGS Hepatobiliary: No focal liver abnormality is seen. No radiopaque gallstones, biliary dilatation, or pericholecystic inflammatory changes. Pancreas: Unremarkable. No  pancreatic ductal dilatation or surrounding inflammatory changes. Spleen: Normal in size without focal abnormality. Adrenals/Urinary Tract: Adrenal glands are normal in  appearance. Symmetric enhancement and excretion from both kidneys. No renal mass. Ureters are unremarkable. The bladder and visualized portion of the urethra are normal. Stomach/Bowel: Small hiatal hernia. Small bowel loops are normal in appearance. The appendix is well seen and is normal in caliber. Tiny appendicolith without evidence for acute appendicitis. Large bowel is unremarkable. Vascular/Lymphatic: The RIGHT common femoral artery is expanded and low attenuation, suspicious for deep vein thrombosis. There is slight heterogeneous appearance of the LEFT common femoral vein, possibly related to in flow. Consider thrombosis. No retroperitoneal or mesenteric adenopathy. Reproductive: The uterus is present. No adnexal mass. No free pelvic fluid. Other: No abdominal wall hernia or abnormality. No abdominopelvic ascites. Musculoskeletal: No acute or significant osseous findings. Review of the MIP images confirms the above findings. IMPRESSION: 1. Significant pulmonary emboli bilaterally. There is no evidence for RIGHT heart strain. 2. Bibasilar atelectasis; suspect consolidation or infarct in the RIGHT LOWER lobe. 3. Small hiatal hernia. 4. Normal appendix. 5. Suspect RIGHT femoral vein thrombosis. Possible LEFT femoral vein thrombosis. Recommend further evaluation with bilateral LOWER extremity Doppler exams. Critical Value/emergent results were called by telephone at the time of interpretation on 11/30/2018 at 1:10 pm to Dr. Ileana Roup , who verbally acknowledged these results. Electronically Signed   By: Norva Pavlov M.D.   On: 11/30/2018 13:28   Ct Abdomen Pelvis W Contrast  Result Date: 11/30/2018 CLINICAL DATA:  Pt states increasing chest pain and SOB. Pt also states RUQ pain worsening over the last day. EXAM: CT ANGIOGRAPHY CHEST CT ABDOMEN AND PELVIS WITH CONTRAST TECHNIQUE: Multidetector CT imaging of the chest was performed using the standard protocol during bolus administration of intravenous  contrast. Multiplanar CT image reconstructions and MIPs were obtained to evaluate the vascular anatomy. Multidetector CT imaging of the abdomen and pelvis was performed using the standard protocol during bolus administration of intravenous contrast. CONTRAST:  69mL OMNIPAQUE IOHEXOL 350 MG/ML SOLN COMPARISON:  11/30/2018 FINDINGS: CTA CHEST FINDINGS Cardiovascular: There are large pulmonary emboli involving the RIGHT main pulmonary artery extending into the UPPER and LOWER lobe arteries. Significant LEFT pulmonary embolus involves the LOWER lobe arteries and extends into LOWER lobe branches. No evidence for RIGHT heart strain. Incidental note is made of LEFT common carotid originating from the RIGHT brachiocephalic artery. Otherwise the thoracic aorta is normal. Mediastinum/Nodes: Small hiatal hernia. Esophagus other wise is normal. Thyroid is normal in appearance. No significant adenopathy. Lungs/Pleura: There is bibasilar atelectasis. Consolidation at the RIGHT LOWER lobe may represent infarct. No suspicious pulmonary nodules. Musculoskeletal: No chest wall abnormality. No acute or significant osseous findings. Review of the MIP images confirms the above findings. CT ABDOMEN and PELVIS FINDINGS Hepatobiliary: No focal liver abnormality is seen. No radiopaque gallstones, biliary dilatation, or pericholecystic inflammatory changes. Pancreas: Unremarkable. No pancreatic ductal dilatation or surrounding inflammatory changes. Spleen: Normal in size without focal abnormality. Adrenals/Urinary Tract: Adrenal glands are normal in appearance. Symmetric enhancement and excretion from both kidneys. No renal mass. Ureters are unremarkable. The bladder and visualized portion of the urethra are normal. Stomach/Bowel: Small hiatal hernia. Small bowel loops are normal in appearance. The appendix is well seen and is normal in caliber. Tiny appendicolith without evidence for acute appendicitis. Large bowel is unremarkable.  Vascular/Lymphatic: The RIGHT common femoral artery is expanded and low attenuation, suspicious for deep vein thrombosis. There is slight heterogeneous appearance of the LEFT  common femoral vein, possibly related to in flow. Consider thrombosis. No retroperitoneal or mesenteric adenopathy. Reproductive: The uterus is present. No adnexal mass. No free pelvic fluid. Other: No abdominal wall hernia or abnormality. No abdominopelvic ascites. Musculoskeletal: No acute or significant osseous findings. Review of the MIP images confirms the above findings. IMPRESSION: 1. Significant pulmonary emboli bilaterally. There is no evidence for RIGHT heart strain. 2. Bibasilar atelectasis; suspect consolidation or infarct in the RIGHT LOWER lobe. 3. Small hiatal hernia. 4. Normal appendix. 5. Suspect RIGHT femoral vein thrombosis. Possible LEFT femoral vein thrombosis. Recommend further evaluation with bilateral LOWER extremity Doppler exams. Critical Value/emergent results were called by telephone at the time of interpretation on 11/30/2018 at 1:10 pm to Dr. Ileana RoupJAMES MCSHANE , who verbally acknowledged these results. Electronically Signed   By: Norva PavlovElizabeth  Brown M.D.   On: 11/30/2018 13:28    EKG:  sinus tachycardia  IMPRESSION AND PLAN:  Diana PeerDoris Harvey  is a 48 y.o. female with a known history of sickle cell trait, diabetes, hyperlipidemia comes to the emergency room with increasing shortness of breath and chest pain for 2 to 3 days. [facility] was seen at [facility] and was sent to the emergency room where she was found to be tachycardic. Workup in the ER revealed patient has bilateral large PE right more than left without right heart strain and CT abdomen revealing bilateral femoral vein DVT.  1. Bilateral PE large right more than left along with bilateral femoral vein DVT as noted on CT abdomen -etiology unclear -hypercoag workup sent -oncology consultation with Dr. Donneta RombergBrahmanday-- will see patient in the  morning -vascular consultation with Dr. Gilda CreaseSchnier. Spoke with Dr. Gilda CreaseSchnier plans to do lysis therapy tomorrow after he sees the patient. Will keep patient NPO -IV heparin drip -ultrasound Doppler bilateral lower extremity  2. Type II diabetes sliding scale insulin  3. Hyperlipidemia continue statins  4. History of sickle cell trait  5. DVT prophylaxis already on IV heparin drip  No family in the ER     All the records are reviewed and case discussed with ED provider.   CODE STATUS: full  TOTAL  criciatl TIME TAKING CARE OF THIS PATIENT: *50* minutes.    Diana Harvey M.D on 11/30/2018 at 2:29 PM  Between 7am to 6pm - Pager - [phone]  After 6pm go to [email or web address] - password [email or web address] [email or web address]  [facility]  Office  [phone]  CC: Primary care physician; [facility]

## 2018-11-30 NOTE — ED Notes (Signed)
ED TO INPATIENT HANDOFF REPORT  Name/Age/Gender Diana Harvey 48 y.o. female  Code Status    Code Status Orders  (From admission, onward)         Start     Ordered   11/30/18 1718  Full code  Continuous     11/30/18 1717        Code Status History    This patient has a current code status but no historical code status.      Home/SNF/Other Home  Chief Complaint chest pain ems  Level of Care/Admitting Diagnosis ED Disposition    ED Disposition Condition Comment   Admit  Hospital Area: [facility] [[id number]]  Level of Care: Telemetry [5]  Diagnosis: Pulmonary embolism (HCC) [241700]  Admitting Physician: Joselyn Glassman  Attending Physician: Joselyn Glassman  Estimated length of stay: past midnight tomorrow  Certification:: I certify this patient will need inpatient services for at least 2 midnights  PT Class (Do Not Modify): Inpatient [101]  PT Acc Code (Do Not Modify): Private [1]       Medical History Past Medical History:  Diagnosis Date  . Diabetes mellitus without complication (HCC)   . Sickle cell anemia (HCC)     Allergies No Known Allergies  IV Location/Drains/Wounds Patient Lines/Drains/Airways Status   Active Line/Drains/Airways    Name:   Placement date:   Placement time:   Site:   Days:   Peripheral IV 11/30/18 Right Arm   11/30/18    1241    Arm   less than 1   Peripheral IV 11/30/18 Left Arm   11/30/18    1355    Arm   less than 1          Labs/Imaging Results for orders placed or performed during the hospital encounter of 11/30/18 (from the past 48 hour(s))  Basic metabolic panel     Status: Abnormal   Collection Time: 11/30/18 11:29 AM  Result Value Ref Range   Sodium 136 135 - 145 mmol/L   Potassium 3.1 (L) 3.5 - 5.1 mmol/L   Chloride 97 (L) 98 - 111 mmol/L   CO2 29 22 - 32 mmol/L   Glucose, Bld 164 (H) 70 - 99 mg/dL   BUN 11 6 - 20 mg/dL   Creatinine, Ser 4.09 0.44 - 1.00 mg/dL   Calcium 9.5 8.9 -  81.1 mg/dL   GFR calc non Af Amer >60 >60 mL/min   GFR calc Af Amer >60 >60 mL/min   Anion gap 10 5 - 15    Comment: Performed at [facility], [address] [place], [place] [address]  CBC     Status: Abnormal   Collection Time: 11/30/18 11:29 AM  Result Value Ref Range   WBC 13.5 (H) 4.0 - 10.5 K/uL   RBC 4.91 3.87 - 5.11 MIL/uL   Hemoglobin 13.7 12.0 - 15.0 g/dL   HCT 29.5 62.1 - 30.8 %   MCV 85.3 80.0 - 100.0 fL   MCH 27.9 26.0 - 34.0 pg   MCHC 32.7 30.0 - 36.0 g/dL   RDW 65.7 84.6 - 96.2 %   Platelets 218 150 - 400 K/uL   nRBC 0.0 0.0 - 0.2 %    Comment: Performed at [facility], [address] [place], [place] [address]  Troponin I - ONCE - STAT     Status: None   Collection Time: 11/30/18 11:29 AM  Result Value Ref Range   Troponin I <0.03 <  0.03 ng/mL    Comment: Performed at [facility], [address] [place], [place] [address]  Hepatic function panel     Status: Abnormal   Collection Time: 11/30/18 11:29 AM  Result Value Ref Range   Total Protein 8.6 (H) 6.5 - 8.1 g/dL   Albumin 4.2 3.5 - 5.0 g/dL   AST 12 (L) 15 - 41 U/L   ALT 10 0 - 44 U/L   Alkaline Phosphatase 70 38 - 126 U/L   Total Bilirubin 0.8 0.3 - 1.2 mg/dL   Bilirubin, Direct 0.1 0.0 - 0.2 mg/dL   Indirect Bilirubin 0.7 0.3 - 0.9 mg/dL    Comment: Performed at [facility], [address] [place], [place] [address]  Lipase, blood     Status: None   Collection Time: 11/30/18 11:29 AM  Result Value Ref Range   Lipase 27 11 - 51 U/L    Comment: Performed at [facility], [address] [place], [place] [address]  Protime-INR     Status: None   Collection Time: 11/30/18 11:29 AM  Result Value Ref Range   Prothrombin Time 13.7 11.4 - 15.2 seconds   INR 1.06     Comment: Performed at [facility], [address] [place], [place] [address]  APTT     Status: None   Collection Time: 11/30/18 11:29 AM  Result Value Ref Range   aPTT 24 24  - 36 seconds    Comment: Performed at [facility], [address], [place], [place] [address]  Influenza panel by PCR (type A & B)     Status: None   Collection Time: 11/30/18  1:10 PM  Result Value Ref Range   Influenza A By PCR NEGATIVE NEGATIVE   Influenza B By PCR NEGATIVE NEGATIVE    Comment: (NOTE) The Xpert Xpress Flu assay is intended as an aid in the diagnosis of  influenza and should not be used as a sole basis for treatment.  This  assay is FDA approved for nasopharyngeal swab specimens only. Nasal  washings and aspirates are unacceptable for Xpert Xpress Flu testing. Performed at [facility], [address] [place], [place] [address]    Dg Chest 2 View  Result Date: 11/30/2018 CLINICAL DATA:  Chest pain and shortness of breath for 2 days. Sickle cell anemia. Smoker. EXAM: CHEST - 2 VIEW COMPARISON:  None. FINDINGS: Heart size is within normal limits. Elevation of right hemidiaphragm seen. Atelectasis or infiltrate is seen in both lung bases, right side greater than left. IMPRESSION: Bibasilar atelectasis versus infiltrates, right side greater than left. Electronically Signed   By: Myles Rosenthal M.D.   On: 11/30/2018 12:20   Ct Angio Chest Pe W And/or Wo Contrast  Result Date: 11/30/2018 CLINICAL DATA:  Pt states increasing chest pain and SOB. Pt also states RUQ pain worsening over the last day. EXAM: CT ANGIOGRAPHY CHEST CT ABDOMEN AND PELVIS WITH CONTRAST TECHNIQUE: Multidetector CT imaging of the chest was performed using the standard protocol during bolus administration of intravenous contrast. Multiplanar CT image reconstructions and MIPs were obtained to evaluate the vascular anatomy. Multidetector CT imaging of the abdomen and pelvis was performed using the standard protocol during bolus administration of intravenous contrast. CONTRAST:  75mL OMNIPAQUE IOHEXOL 350 MG/ML SOLN COMPARISON:  11/30/2018 FINDINGS: CTA CHEST FINDINGS Cardiovascular: There are  large pulmonary emboli involving the RIGHT main pulmonary artery extending into the UPPER and LOWER lobe arteries. Significant LEFT pulmonary embolus  involves the LOWER lobe arteries and extends into LOWER lobe branches. No evidence for RIGHT heart strain. Incidental note is made of LEFT common carotid originating from the RIGHT brachiocephalic artery. Otherwise the thoracic aorta is normal. Mediastinum/Nodes: Small hiatal hernia. Esophagus other wise is normal. Thyroid is normal in appearance. No significant adenopathy. Lungs/Pleura: There is bibasilar atelectasis. Consolidation at the RIGHT LOWER lobe may represent infarct. No suspicious pulmonary nodules. Musculoskeletal: No chest wall abnormality. No acute or significant osseous findings. Review of the MIP images confirms the above findings. CT ABDOMEN and PELVIS FINDINGS Hepatobiliary: No focal liver abnormality is seen. No radiopaque gallstones, biliary dilatation, or pericholecystic inflammatory changes. Pancreas: Unremarkable. No pancreatic ductal dilatation or surrounding inflammatory changes. Spleen: Normal in size without focal abnormality. Adrenals/Urinary Tract: Adrenal glands are normal in appearance. Symmetric enhancement and excretion from both kidneys. No renal mass. Ureters are unremarkable. The bladder and visualized portion of the urethra are normal. Stomach/Bowel: Small hiatal hernia. Small bowel loops are normal in appearance. The appendix is well seen and is normal in caliber. Tiny appendicolith without evidence for acute appendicitis. Large bowel is unremarkable. Vascular/Lymphatic: The RIGHT common femoral artery is expanded and low attenuation, suspicious for deep vein thrombosis. There is slight heterogeneous appearance of the LEFT common femoral vein, possibly related to in flow. Consider thrombosis. No retroperitoneal or mesenteric adenopathy. Reproductive: The uterus is present. No adnexal mass. No free pelvic fluid. Other: No abdominal  wall hernia or abnormality. No abdominopelvic ascites. Musculoskeletal: No acute or significant osseous findings. Review of the MIP images confirms the above findings. IMPRESSION: 1. Significant pulmonary emboli bilaterally. There is no evidence for RIGHT heart strain. 2. Bibasilar atelectasis; suspect consolidation or infarct in the RIGHT LOWER lobe. 3. Small hiatal hernia. 4. Normal appendix. 5. Suspect RIGHT femoral vein thrombosis. Possible LEFT femoral vein thrombosis. Recommend further evaluation with bilateral LOWER extremity Doppler exams. Critical Value/emergent results were called by telephone at the time of interpretation on 11/30/2018 at 1:10 pm to Dr. Ileana RoupJAMES MCSHANE , who verbally acknowledged these results. Electronically Signed   By: Norva PavlovElizabeth  Brown M.D.   On: 11/30/2018 13:28   Ct Abdomen Pelvis W Contrast  Result Date: 11/30/2018 CLINICAL DATA:  Pt states increasing chest pain and SOB. Pt also states RUQ pain worsening over the last day. EXAM: CT ANGIOGRAPHY CHEST CT ABDOMEN AND PELVIS WITH CONTRAST TECHNIQUE: Multidetector CT imaging of the chest was performed using the standard protocol during bolus administration of intravenous contrast. Multiplanar CT image reconstructions and MIPs were obtained to evaluate the vascular anatomy. Multidetector CT imaging of the abdomen and pelvis was performed using the standard protocol during bolus administration of intravenous contrast. CONTRAST:  75mL OMNIPAQUE IOHEXOL 350 MG/ML SOLN COMPARISON:  11/30/2018 FINDINGS: CTA CHEST FINDINGS Cardiovascular: There are large pulmonary emboli involving the RIGHT main pulmonary artery extending into the UPPER and LOWER lobe arteries. Significant LEFT pulmonary embolus involves the LOWER lobe arteries and extends into LOWER lobe branches. No evidence for RIGHT heart strain. Incidental note is made of LEFT common carotid originating from the RIGHT brachiocephalic artery. Otherwise the thoracic aorta is normal.  Mediastinum/Nodes: Small hiatal hernia. Esophagus other wise is normal. Thyroid is normal in appearance. No significant adenopathy. Lungs/Pleura: There is bibasilar atelectasis. Consolidation at the RIGHT LOWER lobe may represent infarct. No suspicious pulmonary nodules. Musculoskeletal: No chest wall abnormality. No acute or significant osseous findings. Review of the MIP images confirms the above findings. CT ABDOMEN and PELVIS FINDINGS Hepatobiliary: No focal liver abnormality  is seen. No radiopaque gallstones, biliary dilatation, or pericholecystic inflammatory changes. Pancreas: Unremarkable. No pancreatic ductal dilatation or surrounding inflammatory changes. Spleen: Normal in size without focal abnormality. Adrenals/Urinary Tract: Adrenal glands are normal in appearance. Symmetric enhancement and excretion from both kidneys. No renal mass. Ureters are unremarkable. The bladder and visualized portion of the urethra are normal. Stomach/Bowel: Small hiatal hernia. Small bowel loops are normal in appearance. The appendix is well seen and is normal in caliber. Tiny appendicolith without evidence for acute appendicitis. Large bowel is unremarkable. Vascular/Lymphatic: The RIGHT common femoral artery is expanded and low attenuation, suspicious for deep vein thrombosis. There is slight heterogeneous appearance of the LEFT common femoral vein, possibly related to in flow. Consider thrombosis. No retroperitoneal or mesenteric adenopathy. Reproductive: The uterus is present. No adnexal mass. No free pelvic fluid. Other: No abdominal wall hernia or abnormality. No abdominopelvic ascites. Musculoskeletal: No acute or significant osseous findings. Review of the MIP images confirms the above findings. IMPRESSION: 1. Significant pulmonary emboli bilaterally. There is no evidence for RIGHT heart strain. 2. Bibasilar atelectasis; suspect consolidation or infarct in the RIGHT LOWER lobe. 3. Small hiatal hernia. 4. Normal  appendix. 5. Suspect RIGHT femoral vein thrombosis. Possible LEFT femoral vein thrombosis. Recommend further evaluation with bilateral LOWER extremity Doppler exams. Critical Value/emergent results were called by telephone at the time of interpretation on 11/30/2018 at 1:10 pm to Dr. Ileana RoupJAMES MCSHANE , who verbally acknowledged these results. Electronically Signed   By: Norva PavlovElizabeth  Brown M.D.   On: 11/30/2018 13:28   [place] Venous Img Lower Bilateral  Result Date: 11/30/2018 CLINICAL DATA:  Acute pulmonary embolus by chest CTA EXAM: BILATERAL LOWER EXTREMITY VENOUS DOPPLER ULTRASOUND TECHNIQUE: Gray-scale sonography with graded compression, as well as color Doppler and duplex ultrasound were performed to evaluate the lower extremity deep venous systems from the level of the common femoral vein and including the common femoral, femoral, profunda femoral, popliteal and calf veins including the posterior tibial, peroneal and gastrocnemius veins when visible. The superficial great saphenous vein was also interrogated. Spectral Doppler was utilized to evaluate flow at rest and with distal augmentation maneuvers in the common femoral, femoral and popliteal veins. COMPARISON:  None. FINDINGS: RIGHT LOWER EXTREMITY Common Femoral Vein: No evidence of thrombus. Normal compressibility, respiratory phasicity and response to augmentation. Saphenofemoral Junction: No evidence of thrombus. Normal compressibility and flow on color Doppler imaging. Profunda Femoral Vein: No evidence of thrombus. Normal compressibility and flow on color Doppler imaging. Femoral Vein: No evidence of thrombus. Normal compressibility, respiratory phasicity and response to augmentation. Popliteal Vein: No evidence of thrombus. Normal compressibility, respiratory phasicity and response to augmentation. Calf Veins: No evidence of thrombus. Normal compressibility and flow on color Doppler imaging. Superficial Great Saphenous Vein: No evidence of thrombus.  Normal compressibility. Venous Reflux:  None. Other Findings:  None. LEFT LOWER EXTREMITY Common Femoral Vein: No evidence of thrombus. Normal compressibility, respiratory phasicity and response to augmentation. Saphenofemoral Junction: No evidence of thrombus. Normal compressibility and flow on color Doppler imaging. Profunda Femoral Vein: No evidence of thrombus. Normal compressibility and flow on color Doppler imaging. Femoral Vein: No evidence of thrombus. Normal compressibility, respiratory phasicity and response to augmentation. Popliteal Vein: No evidence of thrombus. Normal compressibility, respiratory phasicity and response to augmentation. Calf Veins: No evidence of thrombus. Normal compressibility and flow on color Doppler imaging. Superficial Great Saphenous Vein: No evidence of thrombus. Normal compressibility. Venous Reflux:  None. Other Findings:  None. IMPRESSION: No evidence of deep venous thrombosis.  Electronically Signed   By: Judie Petit.  Shick M.D.   On: 11/30/2018 15:29    Pending Labs Unresulted Labs (From admission, onward)    Start     Ordered   12/01/18 0500  CBC  Daily,   STAT    Comments:  Daily while on heparin infusion    11/30/18 1334   11/30/18 1414  Antithrombin III  (Hypercoagulable Panel, Comprehensive (PNL))  Once,   STAT     11/30/18 1414   11/30/18 1414  Lupus anticoagulant panel  (Hypercoagulable Panel, Comprehensive (PNL))  Once,   STAT     11/30/18 1414   11/30/18 1414  Beta-2-glycoprotein i abs, IgG/M/A  (Hypercoagulable Panel, Comprehensive (PNL))  Once,   STAT     11/30/18 1414   11/30/18 1414  Homocysteine, serum  (Hypercoagulable Panel, Comprehensive (PNL))  Once,   STAT     11/30/18 1414   11/30/18 1414  Factor 5 leiden  (Hypercoagulable Panel, Comprehensive (PNL))  Once,   STAT     11/30/18 1414   11/30/18 1414  Prothrombin gene mutation  (Hypercoagulable Panel, Comprehensive (PNL))  Once,   STAT     11/30/18 1414   11/30/18 1414  Cardiolipin antibodies,  IgG, IgM, IgA  (Hypercoagulable Panel, Comprehensive (PNL))  Once,   STAT     11/30/18 1414          Vitals/Pain Today's Vitals   11/30/18 1617 11/30/18 1630 11/30/18 1659 11/30/18 1700  BP:  118/71  112/65  Pulse:  85  82  Resp:   (!) 26   Temp:      TempSrc:      SpO2:  96%  97%  Weight:      Height:      PainSc: 10-Worst pain ever       Isolation Precautions No active isolations  Medications Medications  heparin ADULT infusion 100 units/mL (25000 units/274mL sodium chloride 0.45%) (1,300 Units/hr Intravenous New Bag/Given 11/30/18 1356)  simvastatin (ZOCOR) tablet 40 mg (has no administration in time range)  DULoxetine (CYMBALTA) DR capsule 60 mg (has no administration in time range)  ziprasidone (GEODON) capsule 80 mg (has no administration in time range)  zolpidem (AMBIEN) tablet 10 mg (has no administration in time range)  insulin aspart (novoLOG) injection 0-9 Units (has no administration in time range)  acetaminophen (TYLENOL) tablet 650 mg (has no administration in time range)    Or  acetaminophen (TYLENOL) suppository 650 mg (has no administration in time range)  oxyCODONE (Oxy IR/ROXICODONE) immediate release tablet 5 mg (has no administration in time range)  ketorolac (TORADOL) 30 MG/ML injection 30 mg (30 mg Intravenous Given 11/30/18 1618)  polyethylene glycol (MIRALAX / GLYCOLAX) packet 17 g (has no administration in time range)  ondansetron (ZOFRAN) tablet 4 mg (has no administration in time range)    Or  ondansetron (ZOFRAN) injection 4 mg (has no administration in time range)  pneumococcal 23 valent vaccine (PNU-IMMUNE) injection 0.5 mL (has no administration in time range)  Influenza vac split quadrivalent PF (FLUARIX) injection 0.5 mL (has no administration in time range)  fentaNYL (SUBLIMAZE) injection 50 mcg (50 mcg Intravenous Given 11/30/18 1229)  iohexol (OMNIPAQUE) 350 MG/ML injection 75 mL (75 mLs Intravenous Contrast Given 11/30/18 1250)  heparin  bolus via infusion 4,700 Units (4,700 Units Intravenous Bolus from Bag 11/30/18 1357)    Mobility Walks

## 2018-11-30 NOTE — ED Notes (Signed)
First nurse note: rule out PE, was seen at [facility] today and sent here via EMS. 132/80; HR102, 95% RA CBG 200. Appears in NAD.

## 2018-11-30 NOTE — ED Provider Notes (Signed)
[facility] Emergency Department Provider Note  ____________________________________________   I have reviewed the triage vital signs and the nursing notes. Where available I have reviewed prior notes and, if possible and indicated, outside hospital notes.    HISTORY  Chief Complaint Chest Pain    HPI Diana Harvey is a 48 y.o. female  History of what appears to be sickle cell trait, diabetes, but no history of PE or DVT and no history of early cardiac disease presents today complaining of chest pain on the right side.  She states she is a little bit short of breath.  Started yesterday.  The pain is sharp, has had a "slight cough".  Denies any fever or chills.  She also has right upper quadrant abdominal discomfort.  She denies any hemoptysis or recent travel she is not on any exogenous estrogens and she has had her tubes tied and she does not believe to be pregnant.  Pain is persistent.  It is worse when she lies down better when she sits up.  No radiation, mostly she feels it on the right side.  Past Medical History:  Diagnosis Date  . Diabetes mellitus without complication (HCC)   . Sickle cell anemia (HCC)     There are no active problems to display for this patient.   History reviewed. No pertinent surgical history.  Prior to Admission medications   Medication Sig Start Date End Date Taking? Authorizing Provider  aspirin EC 81 MG tablet Take 81 mg by mouth daily. 03/20/17  Yes [provider]  DULoxetine (CYMBALTA) 60 MG capsule Take 60 mg by mouth every evening.    Yes [provider]  ziprasidone (GEODON) 80 MG capsule Take 80 mg by mouth at bedtime. 01/06/18  Yes [provider]  zolpidem (AMBIEN) 10 MG tablet Take 10 mg by mouth at bedtime as needed for sleep. 12/28/17  Yes [provider]  naproxen (NAPROSYN) 500 MG tablet Take 1 tablet (500 mg total) by mouth 2 (two) times daily with a meal. 12/04/16   Triplett, Cari  B, FNP    Allergies Patient has no known allergies.  No family history on file.  Social History Social History   Tobacco Use  . Smoking status: Current Every Day Smoker  . Smokeless tobacco: Never Used  Substance Use Topics  . Alcohol use: No  . Drug use: Not Currently    Review of Systems Constitutional: No fever/chills Eyes: No visual changes. ENT: No sore throat. No stiff neck no neck pain Cardiovascular: + chest pain. Respiratory: No cough mild shortness of breath. Gastrointestinal:   no vomiting.  No diarrhea.  No constipation. Genitourinary: Negative for dysuria. Musculoskeletal: Negative lower extremity swelling Skin: Negative for rash. Neurological: Negative for severe headaches, focal weakness or numbness.   ____________________________________________   PHYSICAL EXAM:  VITAL SIGNS: ED Triage Vitals  Enc Vitals Group     BP 11/30/18 1127 94/74     Pulse Rate 11/30/18 1127 (!) 101     Resp 11/30/18 1127 16     Temp 11/30/18 1127 98.5 F (36.9 C)     Temp Source 11/30/18 1127 Oral     SpO2 11/30/18 1127 98 %     Weight 11/30/18 1327 183 lb (83 kg)     Height 11/30/18 1327 5\' 7"  (1.702 m)     Head Circumference --      Peak Flow --      Pain Score 11/30/18 1125 10  Pain Loc --      Pain Edu? --      Excl. in GC? --     Constitutional: Alert and oriented. Well appearing and in no acute distress. Eyes: Conjunctivae are normal Head: Atraumatic HEENT: No congestion/rhinnorhea. Mucous membranes are moist.  Oropharynx non-erythematous Neck:   Nontender with no meningismus, no masses, no stridor Cardiovascular: Normal rate, regular rhythm. Grossly normal heart sounds.  Good peripheral circulation. S: Tender palpation of the right chest wall to touch that area patient states "accepts the pain right there", female nurse chaperone present, no Respiratory: Normal respiratory effort.  No retractions. Lungs CTAB. Abdominal: Soft and tenderness palpation  epigastric region and right upper quadrant which are also reproduces her discomfort. No distention. No guarding no rebound, when I asked the patient to lie back she states it hurts her chest and abdomen Back:  There is no focal tenderness or step off.  there is no midline tenderness there are no lesions noted. there is no CVA tenderness Musculoskeletal: No lower extremity tenderness, no upper extremity tenderness. No joint effusions, no DVT signs strong distal pulses no edema Neurologic:  Normal speech and language. No gross focal neurologic deficits are appreciated.  Skin:  Skin is warm, dry and intact. No rash noted. Psychiatric: Mood and affect are normal. Speech and behavior are normal.  ____________________________________________   LABS (all labs ordered are listed, but only abnormal results are displayed)  Labs Reviewed  BASIC METABOLIC PANEL - Abnormal; Notable for the following components:      Result Value   Potassium 3.1 (*)    Chloride 97 (*)    Glucose, Bld 164 (*)    All other components within normal limits  CBC - Abnormal; Notable for the following components:   WBC 13.5 (*)    All other components within normal limits  HEPATIC FUNCTION PANEL - Abnormal; Notable for the following components:   Total Protein 8.6 (*)    AST 12 (*)    All other components within normal limits  TROPONIN I  LIPASE, BLOOD  INFLUENZA PANEL BY PCR (TYPE A & B)  PROTIME-INR  APTT  POC URINE PREG, ED  I-STAT BETA HCG BLOOD, ED ([facility], [facility], [facility] ONLY)    Pertinent labs  results that were available during my care of the patient were reviewed by me and considered in my medical decision making (see chart for details). ____________________________________________  EKG  I personally interpreted any EKGs ordered by me or triage Sinus tach rate 105, normal axis no acute ST elevation or depression, there are Q waves noted in 3, ____________________________________________  RADIOLOGY  Pertinent  labs & imaging results that were available during my care of the patient were reviewed by me and considered in my medical decision making (see chart for details). If possible, patient and/or family made aware of any abnormal findings.  Dg Chest 2 View  Result Date: 11/30/2018 CLINICAL DATA:  Chest pain and shortness of breath for 2 days. Sickle cell anemia. Smoker. EXAM: CHEST - 2 VIEW COMPARISON:  None. FINDINGS: Heart size is within normal limits. Elevation of right hemidiaphragm seen. Atelectasis or infiltrate is seen in both lung bases, right side greater than left. IMPRESSION: Bibasilar atelectasis versus infiltrates, right side greater than left. Electronically Signed   By: Myles Rosenthal M.D.   On: 11/30/2018 12:20   Ct Angio Chest Pe W And/or Wo Contrast  Result Date: 11/30/2018 CLINICAL DATA:  Pt states increasing chest pain and SOB. Pt  also states RUQ pain worsening over the last day. EXAM: CT ANGIOGRAPHY CHEST CT ABDOMEN AND PELVIS WITH CONTRAST TECHNIQUE: Multidetector CT imaging of the chest was performed using the standard protocol during bolus administration of intravenous contrast. Multiplanar CT image reconstructions and MIPs were obtained to evaluate the vascular anatomy. Multidetector CT imaging of the abdomen and pelvis was performed using the standard protocol during bolus administration of intravenous contrast. CONTRAST:  64mL OMNIPAQUE IOHEXOL 350 MG/ML SOLN COMPARISON:  11/30/2018 FINDINGS: CTA CHEST FINDINGS Cardiovascular: There are large pulmonary emboli involving the RIGHT main pulmonary artery extending into the UPPER and LOWER lobe arteries. Significant LEFT pulmonary embolus involves the LOWER lobe arteries and extends into LOWER lobe branches. No evidence for RIGHT heart strain. Incidental note is made of LEFT common carotid originating from the RIGHT brachiocephalic artery. Otherwise the thoracic aorta is normal. Mediastinum/Nodes: Small hiatal hernia. Esophagus other wise is  normal. Thyroid is normal in appearance. No significant adenopathy. Lungs/Pleura: There is bibasilar atelectasis. Consolidation at the RIGHT LOWER lobe may represent infarct. No suspicious pulmonary nodules. Musculoskeletal: No chest wall abnormality. No acute or significant osseous findings. Review of the MIP images confirms the above findings. CT ABDOMEN and PELVIS FINDINGS Hepatobiliary: No focal liver abnormality is seen. No radiopaque gallstones, biliary dilatation, or pericholecystic inflammatory changes. Pancreas: Unremarkable. No pancreatic ductal dilatation or surrounding inflammatory changes. Spleen: Normal in size without focal abnormality. Adrenals/Urinary Tract: Adrenal glands are normal in appearance. Symmetric enhancement and excretion from both kidneys. No renal mass. Ureters are unremarkable. The bladder and visualized portion of the urethra are normal. Stomach/Bowel: Small hiatal hernia. Small bowel loops are normal in appearance. The appendix is well seen and is normal in caliber. Tiny appendicolith without evidence for acute appendicitis. Large bowel is unremarkable. Vascular/Lymphatic: The RIGHT common femoral artery is expanded and low attenuation, suspicious for deep vein thrombosis. There is slight heterogeneous appearance of the LEFT common femoral vein, possibly related to in flow. Consider thrombosis. No retroperitoneal or mesenteric adenopathy. Reproductive: The uterus is present. No adnexal mass. No free pelvic fluid. Other: No abdominal wall hernia or abnormality. No abdominopelvic ascites. Musculoskeletal: No acute or significant osseous findings. Review of the MIP images confirms the above findings. IMPRESSION: 1. Significant pulmonary emboli bilaterally. There is no evidence for RIGHT heart strain. 2. Bibasilar atelectasis; suspect consolidation or infarct in the RIGHT LOWER lobe. 3. Small hiatal hernia. 4. Normal appendix. 5. Suspect RIGHT femoral vein thrombosis. Possible LEFT  femoral vein thrombosis. Recommend further evaluation with bilateral LOWER extremity Doppler exams. Critical Value/emergent results were called by telephone at the time of interpretation on 11/30/2018 at 1:10 pm to Dr. Ileana Roup , who verbally acknowledged these results. Electronically Signed   By: Norva Pavlov M.D.   On: 11/30/2018 13:28   Ct Abdomen Pelvis W Contrast  Result Date: 11/30/2018 CLINICAL DATA:  Pt states increasing chest pain and SOB. Pt also states RUQ pain worsening over the last day. EXAM: CT ANGIOGRAPHY CHEST CT ABDOMEN AND PELVIS WITH CONTRAST TECHNIQUE: Multidetector CT imaging of the chest was performed using the standard protocol during bolus administration of intravenous contrast. Multiplanar CT image reconstructions and MIPs were obtained to evaluate the vascular anatomy. Multidetector CT imaging of the abdomen and pelvis was performed using the standard protocol during bolus administration of intravenous contrast. CONTRAST:  14mL OMNIPAQUE IOHEXOL 350 MG/ML SOLN COMPARISON:  11/30/2018 FINDINGS: CTA CHEST FINDINGS Cardiovascular: There are large pulmonary emboli involving the RIGHT main pulmonary artery extending into the UPPER  and LOWER lobe arteries. Significant LEFT pulmonary embolus involves the LOWER lobe arteries and extends into LOWER lobe branches. No evidence for RIGHT heart strain. Incidental note is made of LEFT common carotid originating from the RIGHT brachiocephalic artery. Otherwise the thoracic aorta is normal. Mediastinum/Nodes: Small hiatal hernia. Esophagus other wise is normal. Thyroid is normal in appearance. No significant adenopathy. Lungs/Pleura: There is bibasilar atelectasis. Consolidation at the RIGHT LOWER lobe may represent infarct. No suspicious pulmonary nodules. Musculoskeletal: No chest wall abnormality. No acute or significant osseous findings. Review of the MIP images confirms the above findings. CT ABDOMEN and PELVIS FINDINGS Hepatobiliary: No  focal liver abnormality is seen. No radiopaque gallstones, biliary dilatation, or pericholecystic inflammatory changes. Pancreas: Unremarkable. No pancreatic ductal dilatation or surrounding inflammatory changes. Spleen: Normal in size without focal abnormality. Adrenals/Urinary Tract: Adrenal glands are normal in appearance. Symmetric enhancement and excretion from both kidneys. No renal mass. Ureters are unremarkable. The bladder and visualized portion of the urethra are normal. Stomach/Bowel: Small hiatal hernia. Small bowel loops are normal in appearance. The appendix is well seen and is normal in caliber. Tiny appendicolith without evidence for acute appendicitis. Large bowel is unremarkable. Vascular/Lymphatic: The RIGHT common femoral artery is expanded and low attenuation, suspicious for deep vein thrombosis. There is slight heterogeneous appearance of the LEFT common femoral vein, possibly related to in flow. Consider thrombosis. No retroperitoneal or mesenteric adenopathy. Reproductive: The uterus is present. No adnexal mass. No free pelvic fluid. Other: No abdominal wall hernia or abnormality. No abdominopelvic ascites. Musculoskeletal: No acute or significant osseous findings. Review of the MIP images confirms the above findings. IMPRESSION: 1. Significant pulmonary emboli bilaterally. There is no evidence for RIGHT heart strain. 2. Bibasilar atelectasis; suspect consolidation or infarct in the RIGHT LOWER lobe. 3. Small hiatal hernia. 4. Normal appendix. 5. Suspect RIGHT femoral vein thrombosis. Possible LEFT femoral vein thrombosis. Recommend further evaluation with bilateral LOWER extremity Doppler exams. Critical Value/emergent results were called by telephone at the time of interpretation on 11/30/2018 at 1:10 pm to Dr. Ileana Roup , who verbally acknowledged these results. Electronically Signed   By: Norva Pavlov M.D.   On: 11/30/2018 13:28   ____________________________________________     PROCEDURES  Procedure(s) performed: None  Procedures  CriticalCare performed: CRITICAL CARE Performed by: Jeanmarie Plant   Total critical care time: 55 minutes  Critical care time was exclusive of separately billable procedures and treating other patients.  Critical care was necessary to treat or prevent imminent or life-threatening deterioration.  Critical care was time spent personally by me on the following activities: development of treatment plan with patient and/or surrogate as well as nursing, discussions with consultants, evaluation of patient's response to treatment, examination of patient, obtaining history from patient or surrogate, ordering and performing treatments and interventions, ordering and review of laboratory studies, ordering and review of radiographic studies, pulse oximetry and re-evaluation of patient's condition.   ____________________________________________   INITIAL IMPRESSION / ASSESSMENT AND PLAN / ED COURSE  Pertinent labs & imaging results that were available during my care of the patient were reviewed by me and considered in my medical decision making (see chart for details).   Patient here with reproducible chest pain in the right chest wall and the right abdomen which is also positional.  However, I was concerned about the Q waves and I did do a CT scan on her chest.  CT scan shows that she has evidence of significant pulmonary emboli bilaterally.  Patient also  had a cough.  I am grateful that we are able to find these PEs., I have talked to vascular surgery about this and they will evaluate her for possible intervention.  There is no right heart strain noted on CT fortunately you I have ordered heparin and asked that it be brought of stat, patient made aware of all of her findings.  We do not have a pregnancy test on her but she is 47 and has had a tubal ligation.  Have discussed her care with Dr. Allena KatzPatel who will admit.    ____________________________________________   FINAL CLINICAL IMPRESSION(S) / ED DIAGNOSES  Final diagnoses:  None      This chart was dictated using voice recognition software.  Despite best efforts to proofread,  errors can occur which can change meaning.      Jeanmarie PlantMcShane, James A, MD 11/30/18 1346

## 2018-12-01 ENCOUNTER — Encounter
Admission: EM | Disposition: A | Discharge status: Home / Self Care | Payer: Self-pay | Source: Home / Self Care | Attending: Specialist

## 2018-12-01 DIAGNOSIS — I2699 Other pulmonary embolism without acute cor pulmonale: Secondary | ICD-10-CM

## 2018-12-01 DIAGNOSIS — D649 Anemia, unspecified: Secondary | ICD-10-CM

## 2018-12-01 DIAGNOSIS — I519 Heart disease, unspecified: Secondary | ICD-10-CM

## 2018-12-01 DIAGNOSIS — F17200 Nicotine dependence, unspecified, uncomplicated: Secondary | ICD-10-CM

## 2018-12-01 DIAGNOSIS — R0902 Hypoxemia: Secondary | ICD-10-CM

## 2018-12-01 DIAGNOSIS — R5081 Fever presenting with conditions classified elsewhere: Secondary | ICD-10-CM

## 2018-12-01 HISTORY — PX: PULMONARY THROMBECTOMY: CATH118295

## 2018-12-01 LAB — CARDIOLIPIN ANTIBODIES, IGG, IGM, IGA: Anticardiolipin IgA: <9 APL U/mL (ref 0–11)

## 2018-12-01 LAB — LUPUS ANTICOAGULANT PANEL
DRVVT: 37.9 s (ref 0.0–47.0)
PTT Lupus Anticoagulant: 32.5 s (ref 0.0–51.9)

## 2018-12-01 LAB — BASIC METABOLIC PANEL
Anion gap: 7 (ref 5–15)
BUN: 17 mg/dL (ref 6–20)
CO2: 32 mmol/L (ref 22–32)
Calcium: 8.7 mg/dL — ABNORMAL LOW (ref 8.9–10.3)
Chloride: 97 mmol/L — ABNORMAL LOW (ref 98–111)
Creatinine, Ser: 1.21 mg/dL — ABNORMAL HIGH (ref 0.44–1.00)
GFR calc Af Amer: >60 mL/min (ref >60)
GFR, EST NON AFRICAN AMERICAN: 53 mL/min — AB (ref >60)
Glucose, Bld: 142 mg/dL — ABNORMAL HIGH (ref 70–99)
Potassium: 3.1 mmol/L — ABNORMAL LOW (ref 3.5–5.1)
Sodium: 136 mmol/L (ref 135–145)

## 2018-12-01 LAB — CBC
HCT: 36.1 % (ref 36.0–46.0)
Hemoglobin: 11.9 g/dL — ABNORMAL LOW (ref 12.0–15.0)
MCH: 27.9 pg (ref 26.0–34.0)
MCHC: 33 g/dL (ref 30.0–36.0)
MCV: 84.7 fL (ref 80.0–100.0)
PLATELETS: 208 10*3/uL (ref 150–400)
RBC: 4.26 MIL/uL (ref 3.87–5.11)
RDW: 12.7 % (ref 11.5–15.5)
WBC: 11.1 10*3/uL — ABNORMAL HIGH (ref 4.0–10.5)
nRBC: 0 % (ref 0.0–0.2)

## 2018-12-01 LAB — BETA-2-GLYCOPROTEIN I ABS, IGG/M/A
Beta-2 Glyco I IgG: <9 GPI IgG units (ref 0–20)
Beta-2-Glycoprotein I IgA: <9 GPI IgA units (ref 0–25)
Beta-2-Glycoprotein I IgM: <9 GPI IgM units (ref 0–32)

## 2018-12-01 LAB — HEPARIN LEVEL (UNFRACTIONATED)
Heparin Unfractionated: 0.43 IU/mL (ref 0.30–0.70)
Heparin Unfractionated: 0.47 IU/mL (ref 0.30–0.70)

## 2018-12-01 LAB — GLUCOSE, CAPILLARY
GLUCOSE-CAPILLARY: 153 mg/dL — AB (ref 70–99)
Glucose-Capillary: 139 mg/dL — ABNORMAL HIGH (ref 70–99)
Glucose-Capillary: 114 mg/dL — ABNORMAL HIGH (ref 70–99)
Glucose-Capillary: 111 mg/dL — ABNORMAL HIGH (ref 70–99)
Glucose-Capillary: 119 mg/dL — ABNORMAL HIGH (ref 70–99)

## 2018-12-01 LAB — HOMOCYSTEINE: Homocysteine: 13 umol/L (ref 0.0–14.5)

## 2018-12-01 LAB — ANTITHROMBIN III: ANTITHROMB III FUNC: 120 % (ref 75–120)

## 2018-12-01 LAB — MAGNESIUM: Magnesium: 2.1 mg/dL (ref 1.7–2.4)

## 2018-12-01 SURGERY — PULMONARY THROMBECTOMY
Anesthesia: Moderate Sedation | Laterality: Bilateral

## 2018-12-01 MED ORDER — HEPARIN SODIUM (PORCINE) 1000 UNIT/ML IJ SOLN
INTRAMUSCULAR | Status: DC | PRN
  Administered 2018-12-01: 3000 [IU] via INTRAVENOUS

## 2018-12-01 MED ORDER — METHYLPREDNISOLONE SODIUM SUCC 125 MG IJ SOLR: 125.0000 mg | INTRAMUSCULAR | Status: DC | PRN

## 2018-12-01 MED ORDER — POTASSIUM CHLORIDE CRYS ER 20 MEQ PO TBCR
40.0000 meq | EXTENDED_RELEASE_TABLET | Freq: Once | ORAL | Status: AC
  Administered 2018-12-01: 40 meq via ORAL
  Filled 2018-12-01: qty 2

## 2018-12-01 MED ORDER — HEPARIN SODIUM (PORCINE) 1000 UNIT/ML IJ SOLN
INTRAMUSCULAR | Status: AC
  Filled 2018-12-01: qty 1

## 2018-12-01 MED ORDER — FENTANYL CITRATE (PF) 100 MCG/2ML IJ SOLN
INTRAMUSCULAR | Status: DC | PRN
  Administered 2018-12-01 (×2): 50 ug via INTRAVENOUS

## 2018-12-01 MED ORDER — LIDOCAINE HCL (PF) 1 % IJ SOLN
INTRAMUSCULAR | Status: AC
  Filled 2018-12-01: qty 30

## 2018-12-01 MED ORDER — FENTANYL CITRATE (PF) 100 MCG/2ML IJ SOLN
INTRAMUSCULAR | Status: AC
  Filled 2018-12-01: qty 2

## 2018-12-01 MED ORDER — IOPAMIDOL (ISOVUE-300) INJECTION 61%
INTRAVENOUS | Status: DC | PRN
  Administered 2018-12-01: 70 mL via INTRAVENOUS

## 2018-12-01 MED ORDER — HEPARIN (PORCINE) 25000 UT/250ML-% IV SOLN
1300.0000 [IU]/h | INTRAVENOUS | Status: DC
  Administered 2018-12-01: 1200 [IU]/h via INTRAVENOUS

## 2018-12-01 MED ORDER — ALTEPLASE 2 MG IJ SOLR
INTRAMUSCULAR | Status: DC | PRN
  Administered 2018-12-01 (×2): 1 mg
  Administered 2018-12-01: 4 mg

## 2018-12-01 MED ORDER — MIDAZOLAM HCL 5 MG/5ML IJ SOLN
INTRAMUSCULAR | Status: AC
  Filled 2018-12-01: qty 5

## 2018-12-01 MED ORDER — HEPARIN (PORCINE) IN NACL 1000-0.9 UT/500ML-% IV SOLN
INTRAVENOUS | Status: AC
  Filled 2018-12-01: qty 1000

## 2018-12-01 MED ORDER — MIDAZOLAM HCL 2 MG/2ML IJ SOLN
INTRAMUSCULAR | Status: DC | PRN
  Administered 2018-12-01: 1 mg via INTRAVENOUS
  Administered 2018-12-01: 2 mg via INTRAVENOUS

## 2018-12-01 MED ORDER — MIDAZOLAM HCL 2 MG/ML PO SYRP: 8.0000 mg | ORAL_SOLUTION | Freq: Once | ORAL | Status: DC | PRN

## 2018-12-01 MED ORDER — ONDANSETRON HCL 4 MG/2ML IJ SOLN: 4.0000 mg | Freq: Four times a day (QID) | INTRAMUSCULAR | Status: DC | PRN

## 2018-12-01 MED ORDER — SODIUM CHLORIDE 0.9 % IV SOLN: INTRAVENOUS | Status: DC

## 2018-12-01 MED ORDER — SIMVASTATIN 20 MG PO TABS
40.0000 mg | ORAL_TABLET | Freq: Every day | ORAL | Status: DC
  Administered 2018-12-02: 40 mg via ORAL
  Filled 2018-12-01: qty 2

## 2018-12-01 MED ORDER — FAMOTIDINE 20 MG PO TABS: 40.0000 mg | ORAL_TABLET | ORAL | Status: DC | PRN

## 2018-12-01 MED ORDER — DIPHENHYDRAMINE HCL 50 MG/ML IJ SOLN: 50.0000 mg | Freq: Once | INTRAMUSCULAR | Status: DC

## 2018-12-01 MED ORDER — STERILE WATER FOR INJECTION IJ SOLN
INTRAMUSCULAR | Status: AC
  Filled 2018-12-01: qty 20

## 2018-12-01 MED ORDER — ALTEPLASE 2 MG IJ SOLR
INTRAMUSCULAR | Status: AC
  Filled 2018-12-01: qty 4

## 2018-12-01 MED ORDER — HYDROMORPHONE HCL 1 MG/ML IJ SOLN: 1.0000 mg | Freq: Once | INTRAMUSCULAR | Status: DC | PRN

## 2018-12-01 SURGICAL SUPPLY — 19 items
CANISTER PENUMBRA ENGINE (MISCELLANEOUS) ×3 IMPLANT
CATH ANGIO 5F 100CM .035 PIG (CATHETERS) ×3 IMPLANT
CATH BEACON 5 .035 100 H1 TIP (CATHETERS) ×3 IMPLANT
CATH INDIGO 8XTORQ 115 KIT (CATHETERS) ×3 IMPLANT
CATH INFINITI JR4 5F (CATHETERS) ×3 IMPLANT
DEVICE TORQUE .025-.038 (MISCELLANEOUS) ×3 IMPLANT
GLIDEWIRE ADV .035X260CM (WIRE) ×3 IMPLANT
NEEDLE ENTRY 21GA 7CM ECHOTIP (NEEDLE) ×3 IMPLANT
PACK ANGIOGRAPHY (CUSTOM PROCEDURE TRAY) ×3 IMPLANT
SET INTRO CAPELLA COAXIAL (SET/KITS/TRAYS/PACK) ×3 IMPLANT
SHEATH 9FRX11 (SHEATH) ×3 IMPLANT
SHEATH BRITE TIP 8FRX11 (SHEATH) ×3 IMPLANT
SHEATH DESTINATION 8F 45CM (SHEATH) ×3 IMPLANT
SHEATH DESTINATION 8FR 90CM (SHEATH) ×3 IMPLANT
SYR MEDRAD MARK V 150ML (SYRINGE) ×3 IMPLANT
TUBING CONTRAST HIGH PRESS 72 (TUBING) ×3 IMPLANT
WIRE AQUATRACK .035X260CM (WIRE) ×3 IMPLANT
WIRE J 3MM .035X145CM (WIRE) ×3 IMPLANT
WIRE MAGIC TORQUE 260C (WIRE) ×3 IMPLANT

## 2018-12-01 NOTE — Consult Note (Signed)
ANTICOAGULATION CONSULT NOTE - Initial Consult  Pharmacy Consult for Heparin Infusion Indication: pulmonary embolus  No Known Allergies  Patient Measurements: Height: 5\' 7"  (170.2 cm) Weight: 180 lb 3.2 oz (81.7 kg) IBW/kg (Calculated) : 61.6 Heparin Dosing Weight: 78.8  Vital Signs: Temp: 97.8 F (36.6 C) (02/12 0743) Temp Source: Oral (02/12 0743) BP: 115/72 (02/12 0743) Pulse Rate: 73 (02/12 0743)  Labs: Recent Labs    11/30/18 1129 11/30/18 1950 12/01/18 0214 12/01/18 0754  HGB 13.7  --  11.9*  --   HCT 41.9  --  36.1  --   PLT 218  --  208  --   APTT 24  --   --   --   LABPROT 13.7  --   --   --   INR 1.06  --   --   --   HEPARINUNFRC  --  0.71* 0.43 0.47  CREATININE 0.93  --  1.21*  --   TROPONINI <0.03  --   --   --     Estimated Creatinine Clearance: 62.5 mL/min (A) (by C-G formula based on SCr of 1.21 mg/dL (H)).   Medical History: Past Medical History:  Diagnosis Date  . Diabetes mellitus without complication (HCC)   . Sickle cell anemia (HCC)     Medications:  Medications Prior to Admission  Medication Sig Dispense Refill Last Dose  . aspirin EC 81 MG tablet Take 81 mg by mouth daily.   7+ days at Unknown  . canagliflozin (INVOKANA) 100 MG TABS tablet Take 100 mg by mouth daily before breakfast.   11/30/2018 at 0800  . DULoxetine (CYMBALTA) 60 MG capsule Take 60 mg by mouth every evening.    11/29/2018 at 1900  . simvastatin (ZOCOR) 40 MG tablet Take 40 mg by mouth daily.   11/30/2018 at 0800  . ziprasidone (GEODON) 80 MG capsule Take 80 mg by mouth at bedtime.   11/29/2018 at 2000  . zolpidem (AMBIEN) 10 MG tablet Take 10 mg by mouth at bedtime as needed for sleep.   Unknown at PRN  . naproxen (NAPROSYN) 500 MG tablet Take 1 tablet (500 mg total) by mouth 2 (two) times daily with a meal. 30 tablet 0     Assessment: 48 yo female with confirmed PE.  Pharmacy has been consulted to dose heparin infusion.  Patient has no history of prior anticoagulation  PTA.  02/12 @ 0200 HL 0.43 therapeutic. 02/12 @ 0754 HL 0.47 therapeutic   Goal of Therapy:  Heparin level 0.3-0.7 units/ml Monitor platelets by anticoagulation protocol: Yes   Plan:  Heparin level is therapeutic x 2. Will continue current rate and will recheck HL with AM labs, CBC trending down will continue to monitor.  Ronnald Ramp, PharmD, BCPS Clinical Pharmacist 12/01/2018,9:41 AM

## 2018-12-01 NOTE — Consult Note (Signed)
[facility] CONSULT NOTE  Patient Care Team: Center, [facility] as PCP - General (General Practice)  CHIEF COMPLAINTS/PURPOSE OF CONSULTATION:  Bilateral PE  HISTORY OF PRESENTING ILLNESS:  Diana Harvey 48 y.o.  female no prior history of DVT or PE; active smoker is currently admitted to the hospital for bilateral PE.  On IV heparin.  Patient noted to have worsening shortness of breath pleuritic chest pain the last 3 to 4 days prior to presenting the hospital.  Patient on evaluation emergency room with a CT of the chest showed bilateral large pulmonary emboli right more than left.  CT scan abdomen pelvis suggestive of pelvic pain thrombosis however bilateral extremities ultrasound negative for DVT.   Patient since admission to hospital noted slight improvement in her breathing.  She continues to be on IV heparin; denies any bleeding issues.  Oncology has been hospital for further evaluation of her PE.  Patient currently awaiting evaluation of vascular surgery for thrombolysis.   Patient denies any birth control pills.  Denies any long car rides or immobility.  No history of malignancy.  Review of Systems  Constitutional: Negative for chills, diaphoresis, fever, malaise/fatigue and weight loss.  HENT: Negative for nosebleeds and sore throat.   Eyes: Negative for double vision.  Respiratory: Positive for cough and shortness of breath. Negative for hemoptysis, sputum production and wheezing.   Cardiovascular: Positive for chest pain (Pleuritic chest pain). Negative for palpitations, orthopnea and leg swelling.  Gastrointestinal: Negative for abdominal pain, blood in stool, constipation, diarrhea, heartburn, melena, nausea and vomiting.  Genitourinary: Negative for dysuria, frequency and urgency.  Musculoskeletal: Negative for back pain and joint pain.  Skin: Negative.  Negative for itching and rash.  Neurological: Negative for dizziness, tingling, focal  weakness, weakness and headaches.  Endo/Heme/Allergies: Does not bruise/bleed easily.  Psychiatric/Behavioral: Negative for depression. The patient is not nervous/anxious and does not have insomnia.      MEDICAL HISTORY:  Past Medical History:  Diagnosis Date  . Diabetes mellitus without complication (HCC)   . Sickle cell anemia (HCC)     SURGICAL HISTORY: History reviewed. No pertinent surgical history.  SOCIAL HISTORY: Social History   Socioeconomic History  . Marital status: Single    Spouse name: Not on file  . Number of children: Not on file  . Years of education: Not on file  . Highest education level: Not on file  Occupational History  . Not on file  Social Needs  . Financial resource strain: Not on file  . Food insecurity:    Worry: Not on file    Inability: Not on file  . Transportation needs:    Medical: Not on file    Non-medical: Not on file  Tobacco Use  . Smoking status: Current Every Day Smoker  . Smokeless tobacco: Never Used  Substance and Sexual Activity  . Alcohol use: No  . Drug use: Not Currently  . Sexual activity: Not Currently  Lifestyle  . Physical activity:    Days per week: Not on file    Minutes per session: Not on file  . Stress: Not on file  Relationships  . Social connections:    Talks on phone: Not on file    Gets together: Not on file    Attends religious service: Not on file    Active member of club or organization: Not on file    Attends meetings of clubs or organizations: Not on file    Relationship status:  Not on file  . Intimate partner violence:    Fear of current or ex partner: Not on file    Emotionally abused: Not on file    Physically abused: Not on file    Forced sexual activity: Not on file  Other Topics Concern  . Not on file  Social History Narrative  . Not on file    FAMILY HISTORY: History reviewed. No pertinent family history.  ALLERGIES:  has No Known Allergies.  MEDICATIONS:  Current  Facility-Administered Medications  Medication Dose Route Frequency Provider Last Rate Last Dose  . 0.9 %  sodium chloride infusion   Intravenous Continuous Schnier, Latina Craver, MD   Stopped at 12/01/18 1538  . acetaminophen (TYLENOL) tablet 650 mg  650 mg Oral Q6H PRN Schnier, Latina Craver, MD       Or  . acetaminophen (TYLENOL) suppository 650 mg  650 mg Rectal Q6H PRN Schnier, Latina Craver, MD      . DULoxetine (CYMBALTA) DR capsule 60 mg  60 mg Oral QPM Schnier, Latina Craver, MD   60 mg at 11/30/18 2324  . heparin ADULT infusion 100 units/mL (25000 units/228mL sodium chloride 0.45%)  1,200 Units/hr Intravenous Continuous Schnier, Latina Craver, MD      . HYDROmorphone (DILAUDID) injection 1 mg  1 mg Intravenous Once PRN Schnier, Latina Craver, MD      . Influenza vac split quadrivalent PF (FLUARIX) injection 0.5 mL  0.5 mL Intramuscular Tomorrow-1000 Schnier, Latina Craver, MD      . insulin aspart (novoLOG) injection 0-9 Units  0-9 Units Subcutaneous TID WC Schnier, Latina Craver, MD   1 Units at 12/01/18 [id number]  . ketorolac (TORADOL) 30 MG/ML injection 30 mg  30 mg Intravenous Q6H PRN Schnier, Latina Craver, MD   30 mg at 12/01/18 1505  . ondansetron (ZOFRAN) tablet 4 mg  4 mg Oral Q6H PRN Schnier, Latina Craver, MD       Or  . ondansetron [facility]) injection 4 mg  4 mg Intravenous Q6H PRN Schnier, Latina Craver, MD      . ondansetron [facility]) injection 4 mg  4 mg Intravenous Q6H PRN Schnier, Latina Craver, MD      . oxyCODONE (Oxy IR/ROXICODONE) immediate release tablet 5 mg  5 mg Oral Q4H PRN Schnier, Latina Craver, MD   5 mg at 12/01/18 1324  . pneumococcal 23 valent vaccine (PNU-IMMUNE) injection 0.5 mL  0.5 mL Intramuscular Tomorrow-1000 Schnier, Latina Craver, MD      . polyethylene glycol (MIRALAX / GLYCOLAX) packet 17 g  17 g Oral Daily PRN Schnier, Latina Craver, MD      . Melene Muller ON 12/02/2018] simvastatin (ZOCOR) tablet 40 mg  40 mg Oral Daily Schnier, Latina Craver, MD      . ziprasidone (GEODON) capsule 80 mg  80 mg Oral QHS Schnier,  Latina Craver, MD   80 mg at 11/30/18 2324  . zolpidem (AMBIEN) tablet 5 mg  5 mg Oral QHS PRN Schnier, Latina Craver, MD   5 mg at 11/30/18 2324      .  PHYSICAL EXAMINATION:  Vitals:   12/01/18 1835 12/01/18 1901  BP:  113/60  Pulse: 99 (!) 102  Resp: (!) 21 (!) 28  Temp:  98.2 F (36.8 C)  SpO2: 95% 94%   Filed Weights   11/30/18 1327 11/30/18 1755 12/01/18 1543  Weight: 183 lb (83 kg) 180 lb 3.2 oz (81.7 kg) 180 lb 3.2 oz (81.7 kg)    Physical Exam  Constitutional: She is oriented to person, place, and time and well-developed, well-nourished, and in no distress.  Accompanied by daughter.  HENT:  Head: Normocephalic and atraumatic.  Mouth/Throat: Oropharynx is clear and moist. No oropharyngeal exudate.  Eyes: Pupils are equal, round, and reactive to light.  Neck: Normal range of motion. Neck supple.  Cardiovascular: Normal rate and regular rhythm.  Pulmonary/Chest: No respiratory distress. She has no wheezes.  Decreased breath sounds bilateral bases.  Abdominal: Soft. Bowel sounds are normal. She exhibits no distension and no mass. There is no abdominal tenderness. There is no rebound and no guarding.  Musculoskeletal: Normal range of motion.        General: No tenderness or edema.  Neurological: She is alert and oriented to person, place, and time.  Skin: Skin is warm.  Psychiatric: Affect normal.     LABORATORY DATA:  I have reviewed the data as listed Lab Results  Component Value Date   WBC 11.1 (H) 12/01/2018   HGB 11.9 (L) 12/01/2018   HCT 36.1 12/01/2018   MCV 84.7 12/01/2018   PLT 208 12/01/2018   Recent Labs    11/30/18 1129 12/01/18 0214  NA 136 136  K 3.1* 3.1*  CL 97* 97*  CO2 29 32  GLUCOSE 164* 142*  BUN 11 17  CREATININE 0.93 1.21*  CALCIUM 9.5 8.7*  GFRNONAA >60 53*  GFRAA >60 >60  PROT 8.6*  --   ALBUMIN 4.2  --   AST 12*  --   ALT 10  --   ALKPHOS 70  --   BILITOT 0.8  --   BILIDIR 0.1  --   IBILI 0.7  --     RADIOGRAPHIC  STUDIES: I have personally reviewed the radiological images as listed and agreed with the findings in the report. Dg Chest 2 View  Result Date: 11/30/2018 CLINICAL DATA:  Chest pain and shortness of breath for 2 days. Sickle cell anemia. Smoker. EXAM: CHEST - 2 VIEW COMPARISON:  None. FINDINGS: Heart size is within normal limits. Elevation of right hemidiaphragm seen. Atelectasis or infiltrate is seen in both lung bases, right side greater than left. IMPRESSION: Bibasilar atelectasis versus infiltrates, right side greater than left. Electronically Signed   By: Myles RosenthalJohn  Stahl M.D.   On: 11/30/2018 12:20   Ct Angio Chest Pe W And/or Wo Contrast  Result Date: 11/30/2018 CLINICAL DATA:  Pt states increasing chest pain and SOB. Pt also states RUQ pain worsening over the last day. EXAM: CT ANGIOGRAPHY CHEST CT ABDOMEN AND PELVIS WITH CONTRAST TECHNIQUE: Multidetector CT imaging of the chest was performed using the standard protocol during bolus administration of intravenous contrast. Multiplanar CT image reconstructions and MIPs were obtained to evaluate the vascular anatomy. Multidetector CT imaging of the abdomen and pelvis was performed using the standard protocol during bolus administration of intravenous contrast. CONTRAST:  75mL OMNIPAQUE IOHEXOL 350 MG/ML SOLN COMPARISON:  11/30/2018 FINDINGS: CTA CHEST FINDINGS Cardiovascular: There are large pulmonary emboli involving the RIGHT main pulmonary artery extending into the UPPER and LOWER lobe arteries. Significant LEFT pulmonary embolus involves the LOWER lobe arteries and extends into LOWER lobe branches. No evidence for RIGHT heart strain. Incidental note is made of LEFT common carotid originating from the RIGHT brachiocephalic artery. Otherwise the thoracic aorta is normal. Mediastinum/Nodes: Small hiatal hernia. Esophagus other wise is normal. Thyroid is normal in appearance. No significant adenopathy. Lungs/Pleura: There is bibasilar atelectasis.  Consolidation at the RIGHT LOWER lobe may represent infarct. No suspicious pulmonary  nodules. Musculoskeletal: No chest wall abnormality. No acute or significant osseous findings. Review of the MIP images confirms the above findings. CT ABDOMEN and PELVIS FINDINGS Hepatobiliary: No focal liver abnormality is seen. No radiopaque gallstones, biliary dilatation, or pericholecystic inflammatory changes. Pancreas: Unremarkable. No pancreatic ductal dilatation or surrounding inflammatory changes. Spleen: Normal in size without focal abnormality. Adrenals/Urinary Tract: Adrenal glands are normal in appearance. Symmetric enhancement and excretion from both kidneys. No renal mass. Ureters are unremarkable. The bladder and visualized portion of the urethra are normal. Stomach/Bowel: Small hiatal hernia. Small bowel loops are normal in appearance. The appendix is well seen and is normal in caliber. Tiny appendicolith without evidence for acute appendicitis. Large bowel is unremarkable. Vascular/Lymphatic: The RIGHT common femoral artery is expanded and low attenuation, suspicious for deep vein thrombosis. There is slight heterogeneous appearance of the LEFT common femoral vein, possibly related to in flow. Consider thrombosis. No retroperitoneal or mesenteric adenopathy. Reproductive: The uterus is present. No adnexal mass. No free pelvic fluid. Other: No abdominal wall hernia or abnormality. No abdominopelvic ascites. Musculoskeletal: No acute or significant osseous findings. Review of the MIP images confirms the above findings. IMPRESSION: 1. Significant pulmonary emboli bilaterally. There is no evidence for RIGHT heart strain. 2. Bibasilar atelectasis; suspect consolidation or infarct in the RIGHT LOWER lobe. 3. Small hiatal hernia. 4. Normal appendix. 5. Suspect RIGHT femoral vein thrombosis. Possible LEFT femoral vein thrombosis. Recommend further evaluation with bilateral LOWER extremity Doppler exams. Critical  Value/emergent results were called by telephone at the time of interpretation on 11/30/2018 at 1:10 pm to Dr. Ileana Roup , who verbally acknowledged these results. Electronically Signed   By: Norva Pavlov M.D.   On: 11/30/2018 13:28   Ct Abdomen Pelvis W Contrast  Result Date: 11/30/2018 CLINICAL DATA:  Pt states increasing chest pain and SOB. Pt also states RUQ pain worsening over the last day. EXAM: CT ANGIOGRAPHY CHEST CT ABDOMEN AND PELVIS WITH CONTRAST TECHNIQUE: Multidetector CT imaging of the chest was performed using the standard protocol during bolus administration of intravenous contrast. Multiplanar CT image reconstructions and MIPs were obtained to evaluate the vascular anatomy. Multidetector CT imaging of the abdomen and pelvis was performed using the standard protocol during bolus administration of intravenous contrast. CONTRAST:  75mL OMNIPAQUE IOHEXOL 350 MG/ML SOLN COMPARISON:  11/30/2018 FINDINGS: CTA CHEST FINDINGS Cardiovascular: There are large pulmonary emboli involving the RIGHT main pulmonary artery extending into the UPPER and LOWER lobe arteries. Significant LEFT pulmonary embolus involves the LOWER lobe arteries and extends into LOWER lobe branches. No evidence for RIGHT heart strain. Incidental note is made of LEFT common carotid originating from the RIGHT brachiocephalic artery. Otherwise the thoracic aorta is normal. Mediastinum/Nodes: Small hiatal hernia. Esophagus other wise is normal. Thyroid is normal in appearance. No significant adenopathy. Lungs/Pleura: There is bibasilar atelectasis. Consolidation at the RIGHT LOWER lobe may represent infarct. No suspicious pulmonary nodules. Musculoskeletal: No chest wall abnormality. No acute or significant osseous findings. Review of the MIP images confirms the above findings. CT ABDOMEN and PELVIS FINDINGS Hepatobiliary: No focal liver abnormality is seen. No radiopaque gallstones, biliary dilatation, or pericholecystic  inflammatory changes. Pancreas: Unremarkable. No pancreatic ductal dilatation or surrounding inflammatory changes. Spleen: Normal in size without focal abnormality. Adrenals/Urinary Tract: Adrenal glands are normal in appearance. Symmetric enhancement and excretion from both kidneys. No renal mass. Ureters are unremarkable. The bladder and visualized portion of the urethra are normal. Stomach/Bowel: Small hiatal hernia. Small bowel loops are normal in appearance. The  appendix is well seen and is normal in caliber. Tiny appendicolith without evidence for acute appendicitis. Large bowel is unremarkable. Vascular/Lymphatic: The RIGHT common femoral artery is expanded and low attenuation, suspicious for deep vein thrombosis. There is slight heterogeneous appearance of the LEFT common femoral vein, possibly related to in flow. Consider thrombosis. No retroperitoneal or mesenteric adenopathy. Reproductive: The uterus is present. No adnexal mass. No free pelvic fluid. Other: No abdominal wall hernia or abnormality. No abdominopelvic ascites. Musculoskeletal: No acute or significant osseous findings. Review of the MIP images confirms the above findings. IMPRESSION: 1. Significant pulmonary emboli bilaterally. There is no evidence for RIGHT heart strain. 2. Bibasilar atelectasis; suspect consolidation or infarct in the RIGHT LOWER lobe. 3. Small hiatal hernia. 4. Normal appendix. 5. Suspect RIGHT femoral vein thrombosis. Possible LEFT femoral vein thrombosis. Recommend further evaluation with bilateral LOWER extremity Doppler exams. Critical Value/emergent results were called by telephone at the time of interpretation on 11/30/2018 at 1:10 pm to Dr. Ileana RoupJAMES MCSHANE , who verbally acknowledged these results. Electronically Signed   By: Norva PavlovElizabeth  Brown M.D.   On: 11/30/2018 13:28   [place] Venous Img Lower Bilateral  Result Date: 11/30/2018 CLINICAL DATA:  Acute pulmonary embolus by chest CTA EXAM: BILATERAL LOWER EXTREMITY  VENOUS DOPPLER ULTRASOUND TECHNIQUE: Gray-scale sonography with graded compression, as well as color Doppler and duplex ultrasound were performed to evaluate the lower extremity deep venous systems from the level of the common femoral vein and including the common femoral, femoral, profunda femoral, popliteal and calf veins including the posterior tibial, peroneal and gastrocnemius veins when visible. The superficial great saphenous vein was also interrogated. Spectral Doppler was utilized to evaluate flow at rest and with distal augmentation maneuvers in the common femoral, femoral and popliteal veins. COMPARISON:  None. FINDINGS: RIGHT LOWER EXTREMITY Common Femoral Vein: No evidence of thrombus. Normal compressibility, respiratory phasicity and response to augmentation. Saphenofemoral Junction: No evidence of thrombus. Normal compressibility and flow on color Doppler imaging. Profunda Femoral Vein: No evidence of thrombus. Normal compressibility and flow on color Doppler imaging. Femoral Vein: No evidence of thrombus. Normal compressibility, respiratory phasicity and response to augmentation. Popliteal Vein: No evidence of thrombus. Normal compressibility, respiratory phasicity and response to augmentation. Calf Veins: No evidence of thrombus. Normal compressibility and flow on color Doppler imaging. Superficial Great Saphenous Vein: No evidence of thrombus. Normal compressibility. Venous Reflux:  None. Other Findings:  None. LEFT LOWER EXTREMITY Common Femoral Vein: No evidence of thrombus. Normal compressibility, respiratory phasicity and response to augmentation. Saphenofemoral Junction: No evidence of thrombus. Normal compressibility and flow on color Doppler imaging. Profunda Femoral Vein: No evidence of thrombus. Normal compressibility and flow on color Doppler imaging. Femoral Vein: No evidence of thrombus. Normal compressibility, respiratory phasicity and response to augmentation. Popliteal Vein: No  evidence of thrombus. Normal compressibility, respiratory phasicity and response to augmentation. Calf Veins: No evidence of thrombus. Normal compressibility and flow on color Doppler imaging. Superficial Great Saphenous Vein: No evidence of thrombus. Normal compressibility. Venous Reflux:  None. Other Findings:  None. IMPRESSION: No evidence of deep venous thrombosis. Electronically Signed   By: Judie PetitM.  Shick M.D.   On: 11/30/2018 15:29    Pulmonary embolism, bilateral [facility](HCC) #48 year old female patient with no prior history of blood clots; active smoker is currently in the hospital for worsening shortness of breath chest pain-diagnosed with bilateral PE.  #Bilateral PE-without right heart strain as noted on the CT scan.  Patient is quite symptomatic.  Agree with IV heparin.  When patient clinically stable on IV heparin-patient could potentially be discharged on novel oral anticoagulation.  Defer to vascular's for thrombolytic therapy.  Discussed with patient the importance of avoiding falls while on anticoagulation.  #Etiology of hypercoagulable state is unclear-hypercoagulable work-up is pending.   #Active smoker-discussed with patient the importance of quitting smoking; counseled at length.  Thank you Dr.Sainani for allowing me to participate in the care of your pleasant patient. Please do not hesitate to contact me with questions or concerns in the interim.  Discussed with Dr. Cherlynn Kaiser.  Patient follow-up with me in the clinic in the next 1 to 2 weeks at discharge in the hospital.  # I reviewed the blood work- with the patient in detail; also reviewed the imaging independently [as summarized above]; and with the patient in detail.   # 60 minutes face-to-face with the patient discussing the above plan of care; more than 50% of time spent on prognosis/ natural history; counseling and coordination.     All questions were answered. The patient knows to call the clinic with any problems, questions or  concerns.    Earna Coder, MD 12/01/2018 7:26 PM

## 2018-12-01 NOTE — Plan of Care (Signed)
Received report from Special Procedures staff.  Pt received 4 doses of tpa, and bilateral PE's were successfully removed.  No IVC filter inserted.  PAD was applied to right femoral access at 1735.  Pt to be transferred back to 2A after 1830 V/S.  Daughter updated on pt's status.

## 2018-12-01 NOTE — Plan of Care (Signed)
Off unit to special procedures unit.

## 2018-12-01 NOTE — Consult Note (Signed)
ANTICOAGULATION CONSULT NOTE - Initial Consult  Pharmacy Consult for Heparin Infusion Indication: pulmonary embolus  No Known Allergies  Patient Measurements: Height: 5\' 7"  (170.2 cm) Weight: 180 lb 3.2 oz (81.7 kg) IBW/kg (Calculated) : 61.6 Heparin Dosing Weight: 78.8  Vital Signs: Temp: 98.9 F (37.2 C) (02/12 1543) Temp Source: Oral (02/12 1543) BP: 126/73 (02/12 1800) Pulse Rate: 98 (02/12 1800)  Labs: Recent Labs    11/30/18 1129 11/30/18 1950 12/01/18 0214 12/01/18 0754  HGB 13.7  --  11.9*  --   HCT 41.9  --  36.1  --   PLT 218  --  208  --   APTT 24  --   --   --   LABPROT 13.7  --   --   --   INR 1.06  --   --   --   HEPARINUNFRC  --  0.71* 0.43 0.47  CREATININE 0.93  --  1.21*  --   TROPONINI <0.03  --   --   --     Estimated Creatinine Clearance: 62.5 mL/min (A) (by C-G formula based on SCr of 1.21 mg/dL (H)).   Medical History: Past Medical History:  Diagnosis Date  . Diabetes mellitus without complication (HCC)   . Sickle cell anemia (HCC)     Medications:  Medications Prior to Admission  Medication Sig Dispense Refill Last Dose  . aspirin EC 81 MG tablet Take 81 mg by mouth daily.   7+ days at Unknown  . canagliflozin (INVOKANA) 100 MG TABS tablet Take 100 mg by mouth daily before breakfast.   11/30/2018 at 0800  . DULoxetine (CYMBALTA) 60 MG capsule Take 60 mg by mouth every evening.    11/29/2018 at 1900  . simvastatin (ZOCOR) 40 MG tablet Take 40 mg by mouth daily.   11/30/2018 at 0800  . ziprasidone (GEODON) 80 MG capsule Take 80 mg by mouth at bedtime.   11/29/2018 at 2000  . zolpidem (AMBIEN) 10 MG tablet Take 10 mg by mouth at bedtime as needed for sleep.   Unknown at PRN  . naproxen (NAPROSYN) 500 MG tablet Take 1 tablet (500 mg total) by mouth 2 (two) times daily with a meal. 30 tablet 0     Assessment: 48 yo female with confirmed PE.  Pharmacy has been consulted to dose heparin infusion.  Patient has no history of prior anticoagulation  PTA.  02/12 @ 0200 HL 0.43 therapeutic. 02/12 @ 0754 HL 0.47 therapeutic   Goal of Therapy:  Heparin level 0.3-0.7 units/ml Monitor platelets by anticoagulation protocol: Yes   Plan:  Patient had thrombolysis procedure completed today.  Per consult sent at 1800 via Dr. Gilda Crease heparin infusion to be restarted in 2 hours at previous rate without bolus on initiation but to follow nomogram thereafter.  Heparin infusion 1200 units/hr to start at 2000.  Will check heparin level every 6 hours until 2 consecutive therapeutic levels then daily and CBC daily while on heparin.  Orinda Kenner, PharmD Clinical Pharmacist 12/01/2018,6:05 PM

## 2018-12-01 NOTE — Clinical Social Work Note (Signed)
CSW received consult that patient needs assistance with where to get food, and also how to help her daughter get Medicaid.  CSW was unable to see patient, due to her being in a procedure.  CSW to attempt to see patient another time.  Ervin Knack. Hassan Rowan, MSW, Theresia Majors [phone]  12/01/2018 5:53 PM

## 2018-12-01 NOTE — Plan of Care (Signed)
Continues on heparin drip, NPO after midnight, daughter at bedside, social work consult placed due to homelife issues Problem: Nutrition: Goal: Adequate nutrition will be maintained Outcome: Progressing   Problem: Elimination: Goal: Will not experience complications related to urinary retention Outcome: Progressing   Problem: Pain Managment: Goal: General experience of comfort will improve Outcome: Progressing Note:  No complaints of pain this shift   Problem: Safety: Goal: Ability to remain free from injury will improve Outcome: Progressing   Problem: Education: Goal: Knowledge of General Education information will improve Description Including pain rating scale, medication(s)/side effects and non-pharmacologic comfort measures Outcome: Completed/Met

## 2018-12-01 NOTE — Plan of Care (Signed)
Pt returned from thrombolysis of bilateral PE's with PAD to right groin with 40 mL of air in bulb.  No hematoma or bleeding noted around access point.  Pedal and post-tibial pulses present (+2) bilaterally.  Able to wiggle toes.  Denies numbness or tingling.  Cap refill at <3 sec.  1. Heparin to resume at 1200 units at 2000h. 2. HOB can be elevated to 40 degrees at 1900h.   3. Bedrest until 2335h 4. PAD can be deflated and removed at 0730h.

## 2018-12-01 NOTE — Assessment & Plan Note (Addendum)
#  48 year old female patient unprovoked bilateral PE.  #Bilateral large Pulmonary emboli- s/p thrombolysis on 2/12.  Currently switched over to Eliquis.  Patient still needing 2 to 3 L of oxygen.  Again discussed the importance of compliance with Eliquis.  Again counseled regarding falls at length.  #Etiology of hypercoagulable state is unclear-hypercoagulable work-pending.   # Fever-likely secondary to PE.  Resolved  #Anemia hemoglobin 9.7-about 4 g drop from baseline.  But no obvious evidence of any abdominal bleed or GI bleed.  This needs to be monitored closely.  #Disposition: Recommend follow-up in 1 week.

## 2018-12-01 NOTE — Progress Notes (Signed)
[facility] [place] [facility]     PATIENT NAME: Diana Harvey    MR#:  [id number]  DATE OF BIRTH:  March 28, 1971  SUBJECTIVE:   Patient presented to the hospital due to chest pain and noted to have pulmonary embolism.  Dopplers were negative for DVT.  Currently on a heparin drip.    REVIEW OF SYSTEMS:    Review of Systems  Constitutional: Negative for chills and fever.  HENT: Negative for congestion and tinnitus.   Eyes: Negative for blurred vision and double vision.  Respiratory: Positive for shortness of breath. Negative for cough and wheezing.   Cardiovascular: Positive for chest pain. Negative for orthopnea and PND.  Gastrointestinal: Negative for abdominal pain, diarrhea, nausea and vomiting.  Genitourinary: Negative for dysuria and hematuria.  Neurological: Negative for dizziness, sensory change and focal weakness.  All other systems reviewed and are negative.   Nutrition: NPO for procedure Tolerating Diet: No Tolerating PT: Ambulatory  DRUG ALLERGIES:  No Known Allergies  VITALS:  Blood pressure 115/72, pulse 73, temperature 99.5 F (37.5 C), temperature source Oral, resp. rate (!) 24, height 5\' 7"  (1.702 m), weight 81.7 kg, last menstrual period 11/22/2018, SpO2 96 %.  PHYSICAL EXAMINATION:   Physical Exam  GENERAL:  48 y.o.-year-old patient lying in bed in no acute distress.  EYES: Pupils equal, round, reactive to light and accommodation. No scleral icterus. Extraocular muscles intact.  HEENT: Head atraumatic, normocephalic. Oropharynx and nasopharynx clear.  NECK:  Supple, no jugular venous distention. No thyroid enlargement, no tenderness.  LUNGS: Poor resp. effort, no wheezing, rales, rhonchi. No use of accessory muscles of respiration.  CARDIOVASCULAR: S1, S2 normal. No murmurs, rubs, or gallops.  ABDOMEN: Soft, nontender, nondistended. Bowel sounds present. No organomegaly or mass.  EXTREMITIES: No cyanosis, clubbing or edema b/l.      NEUROLOGIC: Cranial nerves II through XII are intact. No focal Motor or sensory deficits b/l.   PSYCHIATRIC: The patient is alert and oriented x 3.  SKIN: No obvious rash, lesion, or ulcer.    LABORATORY PANEL:   CBC Recent Labs  Lab 12/01/18 0214  WBC 11.1*  HGB 11.9*  HCT 36.1  PLT 208   ------------------------------------------------------------------------------------------------------------------  Chemistries  Recent Labs  Lab 11/30/18 1129 12/01/18 0214  NA 136 136  K 3.1* 3.1*  CL 97* 97*  CO2 29 32  GLUCOSE 164* 142*  BUN 11 17  CREATININE 0.93 1.21*  CALCIUM 9.5 8.7*  MG  --  2.1  AST 12*  --   ALT 10  --   ALKPHOS 70  --   BILITOT 0.8  --    ------------------------------------------------------------------------------------------------------------------  Cardiac Enzymes Recent Labs  Lab 11/30/18 1129  TROPONINI <0.03   ------------------------------------------------------------------------------------------------------------------  RADIOLOGY:  Dg Chest 2 View  Result Date: 11/30/2018 CLINICAL DATA:  Chest pain and shortness of breath for 2 days. Sickle cell anemia. Smoker. EXAM: CHEST - 2 VIEW COMPARISON:  None. FINDINGS: Heart size is within normal limits. Elevation of right hemidiaphragm seen. Atelectasis or infiltrate is seen in both lung bases, right side greater than left. IMPRESSION: Bibasilar atelectasis versus infiltrates, right side greater than left. Electronically Signed   By: Myles Rosenthal M.D.   On: 11/30/2018 12:20   Ct Angio Chest Pe W And/or Wo Contrast  Result Date: 11/30/2018 CLINICAL DATA:  Pt states increasing chest pain and SOB. Pt also states RUQ pain worsening over the last day. EXAM: CT ANGIOGRAPHY CHEST CT ABDOMEN AND PELVIS WITH CONTRAST  TECHNIQUE: Multidetector CT imaging of the chest was performed using the standard protocol during bolus administration of intravenous contrast. Multiplanar CT image reconstructions and MIPs  were obtained to evaluate the vascular anatomy. Multidetector CT imaging of the abdomen and pelvis was performed using the standard protocol during bolus administration of intravenous contrast. CONTRAST:  75mL OMNIPAQUE IOHEXOL 350 MG/ML SOLN COMPARISON:  11/30/2018 FINDINGS: CTA CHEST FINDINGS Cardiovascular: There are large pulmonary emboli involving the RIGHT main pulmonary artery extending into the UPPER and LOWER lobe arteries. Significant LEFT pulmonary embolus involves the LOWER lobe arteries and extends into LOWER lobe branches. No evidence for RIGHT heart strain. Incidental note is made of LEFT common carotid originating from the RIGHT brachiocephalic artery. Otherwise the thoracic aorta is normal. Mediastinum/Nodes: Small hiatal hernia. Esophagus other wise is normal. Thyroid is normal in appearance. No significant adenopathy. Lungs/Pleura: There is bibasilar atelectasis. Consolidation at the RIGHT LOWER lobe may represent infarct. No suspicious pulmonary nodules. Musculoskeletal: No chest wall abnormality. No acute or significant osseous findings. Review of the MIP images confirms the above findings. CT ABDOMEN and PELVIS FINDINGS Hepatobiliary: No focal liver abnormality is seen. No radiopaque gallstones, biliary dilatation, or pericholecystic inflammatory changes. Pancreas: Unremarkable. No pancreatic ductal dilatation or surrounding inflammatory changes. Spleen: Normal in size without focal abnormality. Adrenals/Urinary Tract: Adrenal glands are normal in appearance. Symmetric enhancement and excretion from both kidneys. No renal mass. Ureters are unremarkable. The bladder and visualized portion of the urethra are normal. Stomach/Bowel: Small hiatal hernia. Small bowel loops are normal in appearance. The appendix is well seen and is normal in caliber. Tiny appendicolith without evidence for acute appendicitis. Large bowel is unremarkable. Vascular/Lymphatic: The RIGHT common femoral artery is expanded  and low attenuation, suspicious for deep vein thrombosis. There is slight heterogeneous appearance of the LEFT common femoral vein, possibly related to in flow. Consider thrombosis. No retroperitoneal or mesenteric adenopathy. Reproductive: The uterus is present. No adnexal mass. No free pelvic fluid. Other: No abdominal wall hernia or abnormality. No abdominopelvic ascites. Musculoskeletal: No acute or significant osseous findings. Review of the MIP images confirms the above findings. IMPRESSION: 1. Significant pulmonary emboli bilaterally. There is no evidence for RIGHT heart strain. 2. Bibasilar atelectasis; suspect consolidation or infarct in the RIGHT LOWER lobe. 3. Small hiatal hernia. 4. Normal appendix. 5. Suspect RIGHT femoral vein thrombosis. Possible LEFT femoral vein thrombosis. Recommend further evaluation with bilateral LOWER extremity Doppler exams. Critical Value/emergent results were called by telephone at the time of interpretation on 11/30/2018 at 1:10 pm to Dr. Ileana Roup , who verbally acknowledged these results. Electronically Signed   By: Norva Pavlov M.D.   On: 11/30/2018 13:28   Ct Abdomen Pelvis W Contrast  Result Date: 11/30/2018 CLINICAL DATA:  Pt states increasing chest pain and SOB. Pt also states RUQ pain worsening over the last day. EXAM: CT ANGIOGRAPHY CHEST CT ABDOMEN AND PELVIS WITH CONTRAST TECHNIQUE: Multidetector CT imaging of the chest was performed using the standard protocol during bolus administration of intravenous contrast. Multiplanar CT image reconstructions and MIPs were obtained to evaluate the vascular anatomy. Multidetector CT imaging of the abdomen and pelvis was performed using the standard protocol during bolus administration of intravenous contrast. CONTRAST:  52mL OMNIPAQUE IOHEXOL 350 MG/ML SOLN COMPARISON:  11/30/2018 FINDINGS: CTA CHEST FINDINGS Cardiovascular: There are large pulmonary emboli involving the RIGHT main pulmonary artery extending into  the UPPER and LOWER lobe arteries. Significant LEFT pulmonary embolus involves the LOWER lobe arteries and extends into LOWER lobe  branches. No evidence for RIGHT heart strain. Incidental note is made of LEFT common carotid originating from the RIGHT brachiocephalic artery. Otherwise the thoracic aorta is normal. Mediastinum/Nodes: Small hiatal hernia. Esophagus other wise is normal. Thyroid is normal in appearance. No significant adenopathy. Lungs/Pleura: There is bibasilar atelectasis. Consolidation at the RIGHT LOWER lobe may represent infarct. No suspicious pulmonary nodules. Musculoskeletal: No chest wall abnormality. No acute or significant osseous findings. Review of the MIP images confirms the above findings. CT ABDOMEN and PELVIS FINDINGS Hepatobiliary: No focal liver abnormality is seen. No radiopaque gallstones, biliary dilatation, or pericholecystic inflammatory changes. Pancreas: Unremarkable. No pancreatic ductal dilatation or surrounding inflammatory changes. Spleen: Normal in size without focal abnormality. Adrenals/Urinary Tract: Adrenal glands are normal in appearance. Symmetric enhancement and excretion from both kidneys. No renal mass. Ureters are unremarkable. The bladder and visualized portion of the urethra are normal. Stomach/Bowel: Small hiatal hernia. Small bowel loops are normal in appearance. The appendix is well seen and is normal in caliber. Tiny appendicolith without evidence for acute appendicitis. Large bowel is unremarkable. Vascular/Lymphatic: The RIGHT common femoral artery is expanded and low attenuation, suspicious for deep vein thrombosis. There is slight heterogeneous appearance of the LEFT common femoral vein, possibly related to in flow. Consider thrombosis. No retroperitoneal or mesenteric adenopathy. Reproductive: The uterus is present. No adnexal mass. No free pelvic fluid. Other: No abdominal wall hernia or abnormality. No abdominopelvic ascites. Musculoskeletal: No  acute or significant osseous findings. Review of the MIP images confirms the above findings. IMPRESSION: 1. Significant pulmonary emboli bilaterally. There is no evidence for RIGHT heart strain. 2. Bibasilar atelectasis; suspect consolidation or infarct in the RIGHT LOWER lobe. 3. Small hiatal hernia. 4. Normal appendix. 5. Suspect RIGHT femoral vein thrombosis. Possible LEFT femoral vein thrombosis. Recommend further evaluation with bilateral LOWER extremity Doppler exams. Critical Value/emergent results were called by telephone at the time of interpretation on 11/30/2018 at 1:10 pm to Dr. Ileana RoupJAMES MCSHANE , who verbally acknowledged these results. Electronically Signed   By: Norva PavlovElizabeth  Brown M.D.   On: 11/30/2018 13:28   [place] Venous Img Lower Bilateral  Result Date: 11/30/2018 CLINICAL DATA:  Acute pulmonary embolus by chest CTA EXAM: BILATERAL LOWER EXTREMITY VENOUS DOPPLER ULTRASOUND TECHNIQUE: Gray-scale sonography with graded compression, as well as color Doppler and duplex ultrasound were performed to evaluate the lower extremity deep venous systems from the level of the common femoral vein and including the common femoral, femoral, profunda femoral, popliteal and calf veins including the posterior tibial, peroneal and gastrocnemius veins when visible. The superficial great saphenous vein was also interrogated. Spectral Doppler was utilized to evaluate flow at rest and with distal augmentation maneuvers in the common femoral, femoral and popliteal veins. COMPARISON:  None. FINDINGS: RIGHT LOWER EXTREMITY Common Femoral Vein: No evidence of thrombus. Normal compressibility, respiratory phasicity and response to augmentation. Saphenofemoral Junction: No evidence of thrombus. Normal compressibility and flow on color Doppler imaging. Profunda Femoral Vein: No evidence of thrombus. Normal compressibility and flow on color Doppler imaging. Femoral Vein: No evidence of thrombus. Normal compressibility, respiratory  phasicity and response to augmentation. Popliteal Vein: No evidence of thrombus. Normal compressibility, respiratory phasicity and response to augmentation. Calf Veins: No evidence of thrombus. Normal compressibility and flow on color Doppler imaging. Superficial Great Saphenous Vein: No evidence of thrombus. Normal compressibility. Venous Reflux:  None. Other Findings:  None. LEFT LOWER EXTREMITY Common Femoral Vein: No evidence of thrombus. Normal compressibility, respiratory phasicity and response to augmentation. Saphenofemoral Junction: No evidence  of thrombus. Normal compressibility and flow on color Doppler imaging. Profunda Femoral Vein: No evidence of thrombus. Normal compressibility and flow on color Doppler imaging. Femoral Vein: No evidence of thrombus. Normal compressibility, respiratory phasicity and response to augmentation. Popliteal Vein: No evidence of thrombus. Normal compressibility, respiratory phasicity and response to augmentation. Calf Veins: No evidence of thrombus. Normal compressibility and flow on color Doppler imaging. Superficial Great Saphenous Vein: No evidence of thrombus. Normal compressibility. Venous Reflux:  None. Other Findings:  None. IMPRESSION: No evidence of deep venous thrombosis. Electronically Signed   By: Judie PetitM.  Shick M.D.   On: 11/30/2018 15:29     ASSESSMENT AND PLAN:   48 year old female with past medical history of sickle cell anemia, diabetes who presented to the hospital due to shortness of breath and chest pain and noted to have a pulmonary embolism.  1.  Bilateral pulmonary embolism right greater than left- source of patient's shortness of breath and chest pain. - Dopplers are negative for DVT. - Thrombophilia work-up has been done and results are pending.  Continue heparin drip, seen by vascular surgery and plan for possible thrombolysis later today.  Can switch to oral anticoagulant likely after the procedure. -Continue supportive care.  2.   Depression-continue Cymbalta.  3.  Diabetes type 2 without complication-continue sliding scale insulin.  4.  HyperLipidemia-continue Zocor.  5. Hypokalemia - cont. To supplement and repeat in a.m. tomorrow.    All the records are reviewed and case discussed with Care Management/Social Worker. Management plans discussed with the patient, family and they are in agreement.  CODE STATUS: full code  DVT Prophylaxis: Hep. gtt  TOTAL TIME TAKING CARE OF THIS PATIENT: 30 minutes.   POSSIBLE D/C IN 1-2 DAYS, DEPENDING ON CLINICAL CONDITION.   Houston SirenSAINANI,Raymel Cull J M.D on 12/01/2018 at 2:44 PM  Between 7am to 6pm - Pager - [phone]  After 6pm go to [email or web address] - Therapist, nutritionalpassword EPAS [facility]  [facility]  Office  [phone]  CC: Primary care physician; [facility]

## 2018-12-01 NOTE — Plan of Care (Signed)
Education provided regarding thrombolysis and possible IVC filter placement.  Aware of NPO status.  Education provided regarding use of insulin during hospitalization even though she is not insulin-dependent DM.  Bed low and locked with bed alarm on for safety.  Reinforced with pt the importance of calling for assistance to use the [facility].  Pt performed personal hygiene while sitting on side of bed.    Pt verbalized understanding of education provided, and verified through teach back.   No skin breakdown or falls during hospitalization.  Pt remained NPO until procedure.

## 2018-12-01 NOTE — Consult Note (Signed)
ANTICOAGULATION CONSULT NOTE - Initial Consult  Pharmacy Consult for Heparin Infusion Indication: pulmonary embolus  No Known Allergies  Patient Measurements: Height: 5\' 7"  (170.2 cm) Weight: 180 lb 3.2 oz (81.7 kg) IBW/kg (Calculated) : 61.6 Heparin Dosing Weight: 78.8  Vital Signs: Temp: 98.1 F (36.7 C) (02/11 1935) Temp Source: Oral (02/11 1935) BP: 111/64 (02/11 1935) Pulse Rate: 86 (02/11 1935)  Labs: Recent Labs    11/30/18 1129 11/30/18 1950 12/01/18 0214  HGB 13.7  --  11.9*  HCT 41.9  --  36.1  PLT 218  --  208  APTT 24  --   --   LABPROT 13.7  --   --   INR 1.06  --   --   HEPARINUNFRC  --  0.71* 0.43  CREATININE 0.93  --  1.21*  TROPONINI <0.03  --   --     Estimated Creatinine Clearance: 62.5 mL/min (A) (by C-G formula based on SCr of 1.21 mg/dL (H)).   Medical History: Past Medical History:  Diagnosis Date  . Diabetes mellitus without complication (HCC)   . Sickle cell anemia (HCC)     Medications:  Medications Prior to Admission  Medication Sig Dispense Refill Last Dose  . aspirin EC 81 MG tablet Take 81 mg by mouth daily.   7+ days at Unknown  . canagliflozin (INVOKANA) 100 MG TABS tablet Take 100 mg by mouth daily before breakfast.   11/30/2018 at 0800  . DULoxetine (CYMBALTA) 60 MG capsule Take 60 mg by mouth every evening.    11/29/2018 at 1900  . simvastatin (ZOCOR) 40 MG tablet Take 40 mg by mouth daily.   11/30/2018 at 0800  . ziprasidone (GEODON) 80 MG capsule Take 80 mg by mouth at bedtime.   11/29/2018 at 2000  . zolpidem (AMBIEN) 10 MG tablet Take 10 mg by mouth at bedtime as needed for sleep.   Unknown at PRN  . naproxen (NAPROSYN) 500 MG tablet Take 1 tablet (500 mg total) by mouth 2 (two) times daily with a meal. 30 tablet 0     Assessment: 48 yo female with confirmed PE.  Pharmacy has been consulted to dose heparin infusion.  Patient has no history of prior anticoagulation PTA.  Goal of Therapy:  Heparin level 0.3-0.7  units/ml Monitor platelets by anticoagulation protocol: Yes   Plan:  02/12 @ 0200 HL 0.43 therapeutic. Will continue current rate and will recheck HL @ 0800, CBC trending down will continue to monitor.  Thomasene Ripple, PharmD Clinical Pharmacist 12/01/2018,3:28 AM

## 2018-12-01 NOTE — Care Management Note (Signed)
Case Management Note  Patient Details  Name: Diana Harvey MRN: [id number] Date of Birth: 08-31-1971  Subjective/Objective:   Patient is from home; lives with her 4 adult children and 2 adolescent children.  Patient is admitted with bilateral PE and femoral DVT.  She is independent in all ADL's.  Current with Dr. Letta Pate at [facility].  She obtains her medications at [facility] as well.  CM consult placed for medication needs.  Patient states she was given "the wrong kind of meter and strips" at [facility] and therefore hasn't been checking blood sugar.  Placed diabetes consult.  Called [facility] and explained the meter situation and she stated she would message the pharmacy.  She uses [employer] transportation.  She is concerned how she will get home on discharge day as she does not have anyone who can pick her up.  She and her family either walk or take the transportation system to get to appointments.  States she has difficulty obtaining food.  She is unemployed and receives a disability check.  SW consult placed.  Provided DSS phone number for patient to assist with setting up her daughter's Medicaid.  Will continue to follow and assist with progress and discharge planning.        Action/Plan:   Expected Discharge Date:                  Expected Discharge Plan:  Home/Self Care  In-House Referral:  Clinical Social Work  Discharge planning Services  CM Consult  Post Acute Care Choice:    Choice offered to:     DME Arranged:    DME Agency:     HH Arranged:    HH Agency:     Status of Service:  In process, will continue to follow  If discussed at Long Length of Stay Meetings, dates discussed:    Additional Comments:  Sherren Kerns, RN 12/01/2018, 2:21 PM

## 2018-12-01 NOTE — Op Note (Signed)
[facility]  Percutaneous Study/Intervention Procedural Note   Date of Surgery: 12/01/2018,5:55 PM  Surgeon:Nicholle Falzon, Latina Craver   Pre-operative Diagnosis: Pulmonary emboli with right heart strain and hypoxia  Post-operative diagnosis:  Same  Procedure(s) Performed:  1.  Selective injection right subsegmental pulmonary arteries  2.  Selective injection left subsegmental pulmonary artery  3.  Infusion of TPA for thrombolysis bilateral   4.  Mechanical thrombectomy pulmonary emboli using the Penumbra CAT 6 right lower and middle lobes    Anesthesia: Conscious sedation was administered under my direct supervision by the interventional radiology RN. IV Versed plus fentanyl were utilized. Continuous ECG, pulse oximetry and blood pressure was monitored throughout the entire procedure.  Conscious sedation was administered for a total of 75 minutes.  Sheath: 8 Fr destination  Contrast: 70 cc   Fluoroscopy Time: 23.1 minutes  Indications:  Patient presented to the hospital with hypoxia and shortness of breath. The patient is unable to get out of bed and transfer to a chair without increased dyspnea. The patient's O2 saturations off nasal cannula are less than 90%. CT angiogram demonstrated bilateral pulmonary emboli associated with significant right heart strain. Given the long-term sequela and the patient's symptomatic condition the risks and benefits for angiography with thrombectomy are reviewed all questions are answered, the patient agrees to proceed.  Procedure:  Diana Harvey a 48 y.o. female who was identified and appropriate procedural time out was performed.  The patient was then placed supine on the table and prepped and draped in the usual sterile fashion.  Ultrasound was used to evaluate the right common femoral vein.  It was compressible indicating it is patent .  A digital ultrasound image was acquired for the permanent record.  A micropuncture needle was  used to access the right common femoral vein under direct ultrasound guidance.  A microwire was then advanced under fluoroscopic guidance followed by micro-sheath.  A 0.035 J wire was advanced without resistance and a 8Fr sheath was placed.  Hand-injection of contrast through the sheath demonstrated wide patency of the femoral as well as the iliac veins and the inferior vena cava.  The J-wire pigtail catheter was advanced up to the right atrium where a bolus injection contrast was used to demonstrate the pulmonary artery outflow.  Aqua track was then exchanged for the J-wire and the pigtail catheter was exchanged for an 45 angled pigtail. Using the combination of the aqua track wire and later an advantage wire and the JR4 catheter the catheter was negotiated into the pulmonary artery.  With the catheter in the left side bolus injection contrast was utilized to demonstrate the left pulmonary artery vasculature. This demonstrated small emboli in the lower lobe.  The catheter was negotiated down into the subsegmental branches of the lower lobe and a total of 1 mg of TPA was infused.  While the TPA was dwelling the JR4 catheter was then advanced into the opposite side and right side pulmonary angiography was performed.  The Penumbra Cat 8 extra torque catheter was then advanced into the thrombus in the right lower lobe. After multiple passes with the catheter were made the catheter was then repositioned into the right middle lobe.  In both the lower and middle lobes multiple segmental branches were treated.  Again, after multiple passes there were performed.  Free flow of blood was noted and therefore the aspiration mode was halted and angiography performed through the cat 8 catheter.  This demonstrated complete resolution of the  thrombus from the lower and middle lobes.    After review these images the catheter and sheath were removed and pressure held. There were no immediate complications.  Findings:    Right pulmonary artery: Thrombus is identified in the right lower lobe as well as the right middle lobe extending into the segmental branches.  Following administration of TPA and then treatment with the penumbra cat 8 device there is resolution of the thrombus.  Left pulmonary: Thrombus is noted in the left lower lobe subsegmental branches this is a relatively small thrombus burden fairly far out in the pulmonary vasculature and therefore TPA is instilled at the segmental level directly into these pulmonary branches but mechanical thrombectomy is not felt to be necessary.    Disposition: Patient was taken to the recovery room in stable condition having tolerated the procedure well.  Diana Harvey 12/01/2018,5:55 PM

## 2018-12-02 ENCOUNTER — Encounter: Payer: Self-pay | Admitting: Vascular Surgery

## 2018-12-02 DIAGNOSIS — Z9889 Other specified postprocedural states: Secondary | ICD-10-CM

## 2018-12-02 LAB — URINALYSIS, ROUTINE W REFLEX MICROSCOPIC
BILIRUBIN URINE: NEGATIVE
Bacteria, UA: NONE SEEN
Glucose, UA: >=500 mg/dL — AB
Ketones, ur: NEGATIVE mg/dL
Leukocytes,Ua: NEGATIVE
Nitrite: NEGATIVE
Protein, ur: NEGATIVE mg/dL
SPECIFIC GRAVITY, URINE: 1.02 (ref 1.005–1.030)
pH: 6 (ref 5.0–8.0)

## 2018-12-02 LAB — CBC
HCT: 35 % — ABNORMAL LOW (ref 36.0–46.0)
Hemoglobin: 11.4 g/dL — ABNORMAL LOW (ref 12.0–15.0)
MCH: 27.7 pg (ref 26.0–34.0)
MCHC: 32.6 g/dL (ref 30.0–36.0)
MCV: 85.2 fL (ref 80.0–100.0)
Platelets: 219 10*3/uL (ref 150–400)
RBC: 4.11 MIL/uL (ref 3.87–5.11)
RDW: 12.7 % (ref 11.5–15.5)
WBC: 10.8 10*3/uL — ABNORMAL HIGH (ref 4.0–10.5)
nRBC: 0 % (ref 0.0–0.2)

## 2018-12-02 LAB — MAGNESIUM: Magnesium: 2.3 mg/dL (ref 1.7–2.4)

## 2018-12-02 LAB — GLUCOSE, CAPILLARY
Glucose-Capillary: 155 mg/dL — ABNORMAL HIGH (ref 70–99)
Glucose-Capillary: 215 mg/dL — ABNORMAL HIGH (ref 70–99)
Glucose-Capillary: 188 mg/dL — ABNORMAL HIGH (ref 70–99)
Glucose-Capillary: 132 mg/dL — ABNORMAL HIGH (ref 70–99)

## 2018-12-02 LAB — HEPARIN LEVEL (UNFRACTIONATED)
HEPARIN UNFRACTIONATED: 0.36 [IU]/mL (ref 0.30–0.70)
Heparin Unfractionated: 0.18 IU/mL — ABNORMAL LOW (ref 0.30–0.70)

## 2018-12-02 LAB — POTASSIUM: Potassium: 3 mmol/L — ABNORMAL LOW (ref 3.5–5.1)

## 2018-12-02 MED ORDER — SODIUM CHLORIDE 0.9 % IV SOLN
2.0000 g | Freq: Two times a day (BID) | INTRAVENOUS | Status: DC
  Administered 2018-12-02 – 2018-12-03 (×4): 2 g via INTRAVENOUS
  Filled 2018-12-02 (×7): qty 2

## 2018-12-02 MED ORDER — POTASSIUM CHLORIDE CRYS ER 20 MEQ PO TBCR
60.0000 meq | EXTENDED_RELEASE_TABLET | Freq: Once | ORAL | Status: AC
  Administered 2018-12-02: 60 meq via ORAL
  Filled 2018-12-02: qty 3

## 2018-12-02 MED ORDER — SODIUM CHLORIDE 0.9 % IV SOLN: INTRAVENOUS | Status: DC | PRN

## 2018-12-02 MED ORDER — VANCOMYCIN HCL IN DEXTROSE 750-5 MG/150ML-% IV SOLN
750.0000 mg | Freq: Two times a day (BID) | INTRAVENOUS | Status: DC
  Filled 2018-12-02: qty 150

## 2018-12-02 MED ORDER — VANCOMYCIN HCL 10 G IV SOLR
1500.0000 mg | Freq: Once | INTRAVENOUS | Status: AC
  Administered 2018-12-02: 1500 mg via INTRAVENOUS
  Filled 2018-12-02: qty 1500

## 2018-12-02 MED ORDER — APIXABAN 5 MG PO TABS
10.0000 mg | ORAL_TABLET | Freq: Two times a day (BID) | ORAL | Status: DC
  Administered 2018-12-02 – 2018-12-03 (×3): 10 mg via ORAL
  Filled 2018-12-02 (×3): qty 2

## 2018-12-02 MED ORDER — APIXABAN 5 MG PO TABS: 5.0000 mg | ORAL_TABLET | Freq: Two times a day (BID) | ORAL | Status: DC

## 2018-12-02 MED ORDER — VANCOMYCIN HCL IN DEXTROSE 1-5 GM/200ML-% IV SOLN
1000.0000 mg | INTRAVENOUS | Status: DC
  Administered 2018-12-03: 1000 mg via INTRAVENOUS
  Filled 2018-12-02: qty 200

## 2018-12-02 MED ORDER — HEPARIN BOLUS VIA INFUSION
2000.0000 [IU] | Freq: Once | INTRAVENOUS | Status: AC
  Administered 2018-12-02: 2000 [IU] via INTRAVENOUS
  Filled 2018-12-02: qty 2000

## 2018-12-02 NOTE — Progress Notes (Signed)
Inpatient Diabetes Program Recommendations  AACE/ADA: New Consensus Statement on Inpatient Glycemic Control   Target Ranges:  Prepandial:   less than 140 mg/dL      Peak postprandial:   less than 180 mg/dL (1-2 hours)      Critically ill patients:  140 - 180 mg/dL   Results for Diana Harvey, Diana Harvey (MRN [id number]) as of 12/02/2018 15:11  Ref. Range 12/01/2018 15:41 12/01/2018 17:53 12/01/2018 21:03 12/02/2018 07:51 12/02/2018 12:10  Glucose-Capillary Latest Ref Range: 70 - 99 mg/dL 606 (H) 301 (H) 601 (H) 155 (H) 215 (H)   Review of Glycemic Control  Diabetes history: DM2 Outpatient Diabetes medications: Invokana 100 mg QAM Current orders for Inpatient glycemic control: Novolog 0-9 units TID with meals  Recommendations: HbgA1C: Please consider ordering an A1C to evaluate glycemic control over the past 2-3 months. Insulin-Correction: Please consider ordering Novolog 0-5 units QHS for bedtime correction.  NOTE: Noted consult for Diabetes Coordinator for "noncompliance".  Spoke with patient regarding DM control and she notes that she has insurance and goes to the [employer] for healthcare. Patient reports that she is taking Invokana 100 mg QAM as prescribed. Patient does not check glucose at home as she needs a new glucometer and testing supplies.  Inquired about prior A1C and patient reports that she does not know what an A1C is. No current A1C in chart. Discussed glucose and A1C goals. Discussed importance of checking CBGs and maintaining good CBG control to prevent long-term and short-term complications.  Encouraged patient to get a new glucometer and check glucose at least 1-2 times per day and follow up with provider at the clinic.  Patient verbalized understanding of information discussed and she states that she has no further questions at this time related to diabetes.  Thanks, Orlando Penner, RN, MSN, CDE Diabetes Coordinator Inpatient Diabetes Program [phone] (Team Pager)

## 2018-12-02 NOTE — Progress Notes (Signed)
ANTICOAGULATION CONSULT NOTE - Initial Consult  Pharmacy Consult for apixaban Indication: pulmonary embolus  No Known Allergies  Patient Measurements: Height: 5\' 7"  (170.2 cm) Weight: 192 lb 3.9 oz (87.2 kg) IBW/kg (Calculated) : 61.6  Vital Signs: Temp: 98.7 F (37.1 C) (02/13 0756) Temp Source: Oral (02/13 0756) BP: 118/68 (02/13 0756) Pulse Rate: 120 (02/13 0756)  Labs: Recent Labs    11/30/18 1129  12/01/18 0214 12/01/18 0754 12/02/18 0209 12/02/18 0902  HGB 13.7  --  11.9*  --  11.4*  --   HCT 41.9  --  36.1  --  35.0*  --   PLT 218  --  208  --  219  --   APTT 24  --   --   --   --   --   LABPROT 13.7  --   --   --   --   --   INR 1.06  --   --   --   --   --   HEPARINUNFRC  --    < > 0.43 0.47 0.18* 0.36  CREATININE 0.93  --  1.21*  --   --   --   TROPONINI <0.03  --   --   --   --   --    < > = values in this interval not displayed.    Estimated Creatinine Clearance: 64.4 mL/min (A) (by C-G formula based on SCr of 1.21 mg/dL (H)).   Medical History: Past Medical History:  Diagnosis Date  . Diabetes mellitus without complication (HCC)   . Sickle cell anemia (HCC)     Medications:  Medications Prior to Admission  Medication Sig Dispense Refill Last Dose  . aspirin EC 81 MG tablet Take 81 mg by mouth daily.   7+ days at Unknown  . canagliflozin (INVOKANA) 100 MG TABS tablet Take 100 mg by mouth daily before breakfast.   11/30/2018 at 0800  . DULoxetine (CYMBALTA) 60 MG capsule Take 60 mg by mouth every evening.    11/29/2018 at 1900  . simvastatin (ZOCOR) 40 MG tablet Take 40 mg by mouth daily.   11/30/2018 at 0800  . ziprasidone (GEODON) 80 MG capsule Take 80 mg by mouth at bedtime.   11/29/2018 at 2000  . zolpidem (AMBIEN) 10 MG tablet Take 10 mg by mouth at bedtime as needed for sleep.   Unknown at PRN  . naproxen (NAPROSYN) 500 MG tablet Take 1 tablet (500 mg total) by mouth 2 (two) times daily with a meal. 30 tablet 0    Scheduled:  . apixaban  10 mg  Oral BID   Followed by  . [START ON 12/05/2018] apixaban  5 mg Oral BID  . DULoxetine  60 mg Oral QPM  . Influenza vac split quadrivalent PF  0.5 mL Intramuscular Tomorrow-1000  . insulin aspart  0-9 Units Subcutaneous TID WC  . pneumococcal 23 valent vaccine  0.5 mL Intramuscular Tomorrow-1000  . potassium chloride  60 mEq Oral Once  . simvastatin  40 mg Oral Daily  . ziprasidone  80 mg Oral QHS   Infusions:  . sodium chloride 75 mL/hr at 12/02/18 0442  . sodium chloride    . ceFEPime (MAXIPIME) IV 2 g (12/02/18 1006)  . vancomycin     PRN: sodium chloride, acetaminophen **OR** acetaminophen, ketorolac, ondansetron **OR** ondansetron (ZOFRAN) IV, ondansetron (ZOFRAN) IV, oxyCODONE, polyethylene glycol, zolpidem Anti-infectives (From admission, onward)   Start     Dose/Rate Route Frequency Ordered  Stop   12/02/18 1500  vancomycin (VANCOCIN) IVPB 750 mg/150 ml premix     750 mg 150 mL/hr over 60 Minutes Intravenous Every 12 hours 12/02/18 0237     12/02/18 0230  vancomycin (VANCOCIN) 1,500 mg in sodium chloride 0.9 % 500 mL IVPB     1,500 mg 250 mL/hr over 120 Minutes Intravenous  Once 12/02/18 0226 12/02/18 [id number]   12/02/18 0230  ceFEPIme (MAXIPIME) 2 g in sodium chloride 0.9 % 100 mL IVPB     2 g 200 mL/hr over 30 Minutes Intravenous Every 12 hours 12/02/18 0227     12/01/18 0700  ceFAZolin (ANCEF) IVPB 2g/100 mL premix    Note to Pharmacy:  Please send with patient to specials   2 g 200 mL/hr over 30 Minutes Intravenous  Once 11/30/18 1722 12/01/18 1630      Assessment: Patient presented to the hospital due to chest pain and noted to have pulmonary embolism.  Dopplers were negative for DVT. Pt was on heparin which has been d/c'ed. Will start apixaban.  Goal of Therapy:  Monitor platelets by anticoagulation protocol: Yes   Plan:  Will give apixaban 10 mg BID for 7 days followed by 5 mg BID. CBC stable.   Ronnald RampKishan S Huey Scalia, PharmD, BCPS 12/02/2018,10:47 AM

## 2018-12-02 NOTE — Discharge Instructions (Signed)
You may shower as of Saturday.  Please keep your groin clean and dry.

## 2018-12-02 NOTE — Clinical Social Work Note (Addendum)
CSW spoke with patient in regards to transportation issues through [place], CSW informed her that she can call Medicaid when she is ready for transportation.  Usually if she notifies them before noon they can transport from the hospital.  CSW also provided contact information for DSS, since she had questions about assistance for her daughter who is pregnant.  CSW provided a list of resources of where she can get food for free on [employer] list.  CSW informed her that unfortunately there are not many resources available in [facility], she expressed understanding, but was appreciative of information given.  CSW to sign off please reconsult if other social work needs arise.  Diana Harvey. Diana Harvey, MSW, Theresia Majors [phone]  12/02/2018 10:26 AM

## 2018-12-02 NOTE — Progress Notes (Signed)
Called pt's pharmacy to confirm ziprasidone dose and if she is still taking it. The pharmacy said she filled it on 11/10/2018. Provider who prescribed it: Dr. Alvie Heidelberg Office Phone# [phone]. I called the office and left a message but no call back. Called to figure out the indication for the ziprasidone.   Thanks,   Paschal Dopp, PharmD, BCPS

## 2018-12-02 NOTE — Consult Note (Signed)
ANTICOAGULATION CONSULT NOTE - Initial Consult  Pharmacy Consult for Heparin Infusion Indication: pulmonary embolus  No Known Allergies  Patient Measurements: Height: 5\' 7"  (170.2 cm) Weight: 180 lb 3.2 oz (81.7 kg) IBW/kg (Calculated) : 61.6 Heparin Dosing Weight: 78.8  Vital Signs: Temp: 101.3 F (38.5 C) (02/13 0137) Temp Source: Oral (02/13 0137) BP: 118/64 (02/13 0137) Pulse Rate: 125 (02/13 0137)  Labs: Recent Labs    11/30/18 1129  12/01/18 0214 12/01/18 0754 12/02/18 0209  HGB 13.7  --  11.9*  --  11.4*  HCT 41.9  --  36.1  --  35.0*  PLT 218  --  208  --  219  APTT 24  --   --   --   --   LABPROT 13.7  --   --   --   --   INR 1.06  --   --   --   --   HEPARINUNFRC  --    < > 0.43 0.47 0.18*  CREATININE 0.93  --  1.21*  --   --   TROPONINI <0.03  --   --   --   --    < > = values in this interval not displayed.    Estimated Creatinine Clearance: 62.5 mL/min (A) (by C-G formula based on SCr of 1.21 mg/dL (H)).   Medical History: Past Medical History:  Diagnosis Date  . Diabetes mellitus without complication (HCC)   . Sickle cell anemia (HCC)     Medications:  Medications Prior to Admission  Medication Sig Dispense Refill Last Dose  . aspirin EC 81 MG tablet Take 81 mg by mouth daily.   7+ days at Unknown  . canagliflozin (INVOKANA) 100 MG TABS tablet Take 100 mg by mouth daily before breakfast.   11/30/2018 at 0800  . DULoxetine (CYMBALTA) 60 MG capsule Take 60 mg by mouth every evening.    11/29/2018 at 1900  . simvastatin (ZOCOR) 40 MG tablet Take 40 mg by mouth daily.   11/30/2018 at 0800  . ziprasidone (GEODON) 80 MG capsule Take 80 mg by mouth at bedtime.   11/29/2018 at 2000  . zolpidem (AMBIEN) 10 MG tablet Take 10 mg by mouth at bedtime as needed for sleep.   Unknown at PRN  . naproxen (NAPROSYN) 500 MG tablet Take 1 tablet (500 mg total) by mouth 2 (two) times daily with a meal. 30 tablet 0     Assessment: 48 yo female with confirmed PE.   Pharmacy has been consulted to dose heparin infusion.  Patient has no history of prior anticoagulation PTA.  02/12 @ 0200 HL 0.43 therapeutic. 02/12 @ 0754 HL 0.47 therapeutic   Goal of Therapy:  Heparin level 0.3-0.7 units/ml Monitor platelets by anticoagulation protocol: Yes   Plan:  02/13 @ 0300 HL 0.18 subtherapeutic. Will bolus w/ heparin 2000 units IV x 1 and increase rate to 1300 units/hr and will recheck HL @ 0900. H/h slightly trending down, plts stable, will continue to monitor.  Diana Harvey  Diana Harvey, PharmD Clinical Pharmacist 12/02/2018,3:18 AM

## 2018-12-02 NOTE — Progress Notes (Signed)
[facility] Daily Progress Note   Subjective: 1 Day Post-Op:    1.  Selective injection right subsegmental pulmonary arteries             2.  Selective injection left subsegmental pulmonary artery             3.  Infusion of TPA for thrombolysis bilateral              4.  Mechanical thrombectomy pulmonary emboli using the Penumbra CAT 6 right lower  and middle lobes  Patient with improvement to chest pain and SOB this AM.   Objective: Vitals:   12/02/18 0349 12/02/18 0503 12/02/18 0630 12/02/18 0756  BP: 124/75  111/64 118/68  Pulse: (!) 124  (!) 122 (!) 120  Resp: (!) 28 20 (!) 24   Temp: 100.2 F (37.9 C)  99.7 F (37.6 C) 98.7 F (37.1 C)  TempSrc: Oral  Oral Oral  SpO2: 99%  96% 92%  Weight: 87.2 kg     Height:        Intake/Output Summary (Last 24 hours) at 12/02/2018 1115 Last data filed at 12/02/2018 0900 Gross per 24 hour  Intake 1771.47 ml  Output 3450 ml  Net -1678.53 ml   Physical Exam: A&Ox3, NAD CV: RRR Pulmonary: CTA Bilaterally Abdomen: Soft, Non-tender, Non-distended, (+) Bowel Sounds Right Groin: PAD removed. Incision is clean, dry and intact. No swelling or drainage noted Vascular: Warm, Non-tender, Minimal Edema   Laboratory: CBC    Component Value Date/Time   WBC 10.8 (H) 12/02/2018 0209   HGB 11.4 (L) 12/02/2018 0209   HCT 35.0 (L) 12/02/2018 0209   PLT 219 12/02/2018 0209   BMET    Component Value Date/Time   NA 136 12/01/2018 0214   K 3.0 (L) 12/02/2018 0209   CL 97 (L) 12/01/2018 0214   CO2 32 12/01/2018 0214   GLUCOSE 142 (H) 12/01/2018 0214   BUN 17 12/01/2018 0214   CREATININE 1.21 (H) 12/01/2018 0214   CALCIUM 8.7 (L) 12/01/2018 0214   GFRNONAA 53 (L) 12/01/2018 0214   GFRAA >60 12/01/2018 0214   Assessment/Planning: The patient is a 48 year old female diagnosed with bilateral PE s/p pulmonary thrombolysis POD#1 1) Patient with improvement to chest pain and SOB this AM 2) Right groin access site is  healing well. No swelling or drainage. PAD removed. 3) Transitioned to Eliquis 4) Patient does not need to follow up with [place] as an outpatient. No DVT's. Pulmonologist / PCP / Hematologist to follow PE's 5) OK to discharge from vascular standpoint when medically stable 6) Vascular to sign off at this time. Please re-consult if needed.  Discussed with Dr. Charlie Pitter  PA-C 12/02/2018 11:15 AM

## 2018-12-02 NOTE — Progress Notes (Signed)
DR. Elisabeth Pigeon  was called about the change in patient's vital signs especially temperature increasing to 101.3.  He was also told about patient continued tachycardia with heart rate in the 120's .  New orders given,  Will continue too monitor patient closely.

## 2018-12-02 NOTE — Consult Note (Signed)
ANTICOAGULATION CONSULT NOTE - Initial Consult  Pharmacy Consult for Heparin Infusion Indication: pulmonary embolus  No Known Allergies  Patient Measurements: Height: 5\' 7"  (170.2 cm) Weight: 192 lb 3.9 oz (87.2 kg) IBW/kg (Calculated) : 61.6 Heparin Dosing Weight: 78.8  Vital Signs: Temp: 98.7 F (37.1 C) (02/13 0756) Temp Source: Oral (02/13 0756) BP: 118/68 (02/13 0756) Pulse Rate: 120 (02/13 0756)  Labs: Recent Labs    11/30/18 1129  12/01/18 0214 12/01/18 0754 12/02/18 0209 12/02/18 0902  HGB 13.7  --  11.9*  --  11.4*  --   HCT 41.9  --  36.1  --  35.0*  --   PLT 218  --  208  --  219  --   APTT 24  --   --   --   --   --   LABPROT 13.7  --   --   --   --   --   INR 1.06  --   --   --   --   --   HEPARINUNFRC  --    < > 0.43 0.47 0.18* 0.36  CREATININE 0.93  --  1.21*  --   --   --   TROPONINI <0.03  --   --   --   --   --    < > = values in this interval not displayed.    Estimated Creatinine Clearance: 64.4 mL/min (A) (by C-G formula based on SCr of 1.21 mg/dL (H)).   Medical History: Past Medical History:  Diagnosis Date  . Diabetes mellitus without complication (HCC)   . Sickle cell anemia (HCC)     Medications:  Medications Prior to Admission  Medication Sig Dispense Refill Last Dose  . aspirin EC 81 MG tablet Take 81 mg by mouth daily.   7+ days at Unknown  . canagliflozin (INVOKANA) 100 MG TABS tablet Take 100 mg by mouth daily before breakfast.   11/30/2018 at 0800  . DULoxetine (CYMBALTA) 60 MG capsule Take 60 mg by mouth every evening.    11/29/2018 at 1900  . simvastatin (ZOCOR) 40 MG tablet Take 40 mg by mouth daily.   11/30/2018 at 0800  . ziprasidone (GEODON) 80 MG capsule Take 80 mg by mouth at bedtime.   11/29/2018 at 2000  . zolpidem (AMBIEN) 10 MG tablet Take 10 mg by mouth at bedtime as needed for sleep.   Unknown at PRN  . naproxen (NAPROSYN) 500 MG tablet Take 1 tablet (500 mg total) by mouth 2 (two) times daily with a meal. 30 tablet  0     Assessment: 48 yo female with confirmed PE.  Pharmacy has been consulted to dose heparin infusion.  Patient has no history of prior anticoagulation PTA.  02/12 @ 0200 HL 0.43 therapeutic. 02/12 @ 0754 HL 0.47 therapeutic  02/13 @ 0300 HL 0.18 subtherapeutic. Will bolus w/ heparin 2000 units IV x 1 and increase rate to 1300 units/hr  02/13 @0902  HL 0.36 therapeutic.  Goal of Therapy:  Heparin level 0.3-0.7 units/ml Monitor platelets by anticoagulation protocol: Yes   Plan:  Will continue current rate and re-check heparin level at 1500.   Ronnald Ramp, PharmD, BCPS Clinical Pharmacist 12/02/2018,10:22 AM

## 2018-12-02 NOTE — Plan of Care (Signed)
Problem: Health Behavior/Discharge Planning: Goal: Ability to manage health-related needs will improve Outcome: Progressing Note:  Case Management assisting with community resources for diabetes. Education provided regarding management of DM at home.   Problem: Clinical Measurements: Goal: Ability to maintain clinical measurements within normal limits will improve Outcome: Progressing Goal: Will remain free from infection Outcome: Progressing Goal: Diagnostic test results will improve Outcome: Progressing Goal: Respiratory complications will improve Outcome: Progressing Note:  PE resolved s/p thrombolysis.  Rhonchi to bilateral bases notes.  DB&C instructed and return demonstrated. Goal: Cardiovascular complication will be avoided Outcome: Progressing   Problem: Activity: Goal: Risk for activity intolerance will decrease Outcome: Progressing Note:  Tolerating ambulation short distances to [facility].   Problem: Nutrition: Goal: Adequate nutrition will be maintained Outcome: Progressing   Problem: Coping: Goal: Level of anxiety will decrease Outcome: Progressing Note:  Pt appears more relaxed.  Pt stated she is less anxious.  Still "scared" to take deep breaths due to increased pain.   Problem: Elimination: Goal: Will not experience complications related to bowel motility Outcome: Progressing Goal: Will not experience complications related to urinary retention Outcome: Progressing   Problem: Pain Managment: Goal: General experience of comfort will improve Outcome: Progressing   Problem: Safety: Goal: Ability to remain free from injury will improve Outcome: Progressing Note:  No falls during admission to date.   Problem: Skin Integrity: Goal: Risk for impaired skin integrity will decrease Outcome: Progressing Note:  Skin intact.  Dressing to femoral access CD&I.  No hematoma or S&S of infection.   Problem: Spiritual Needs Goal: Ability to function at adequate  level Outcome: Progressing   Problem: Education: Goal: Ability to describe self-care measures that may prevent or decrease complications (Diabetes Survival Skills Education) will improve Outcome: Progressing Note:  Education provided to pt and daughter regarding: diabetes basics, S&S of hyper/hypoglycemia (and management at home), sick days, nutrition, neuropathy and foot care.  Understanding verified through teach back. Goal: Individualized Educational Video(s) Outcome: Progressing   Problem: Coping: Goal: Ability to adjust to condition or change in health will improve Outcome: Progressing   Problem: Fluid Volume: Goal: Ability to maintain a balanced intake and output will improve Outcome: Progressing   Problem: Health Behavior/Discharge Planning: Goal: Ability to identify and utilize available resources and services will improve Outcome: Progressing Goal: Ability to manage health-related needs will improve Outcome: Progressing   Problem: Metabolic: Goal: Ability to maintain appropriate glucose levels will improve Outcome: Progressing   Problem: Nutritional: Goal: Maintenance of adequate nutrition will improve Outcome: Progressing Goal: Progress toward achieving an optimal weight will improve Outcome: Progressing   Problem: Skin Integrity: Goal: Risk for impaired skin integrity will decrease Outcome: Progressing   Problem: Tissue Perfusion: Goal: Adequacy of tissue perfusion will improve Outcome: Progressing   Problem: Consults Goal: Venous Thromboembolism Patient Education Description See Patient Education Module for education specifics. Outcome: Progressing Goal: Diagnosis - Venous Thromboembolism (VTE) Description Choose a selection Outcome: Progressing Note:  PE (Pulmonary Embolism) Goal: Pharmacy Consult for anticoagulation Outcome: Progressing Goal: Nutrition Consult-if indicated Outcome: Progressing Goal: Diabetes Guidelines if Diabetic/Glucose >  140 Description If diabetic or lab glucose is > 140 mg/dl - Initiate Diabetes/Hyperglycemia Guidelines & Document Interventions  Outcome: Progressing   Problem: Phase II Progression Outcomes Goal: Therapeutic drug levels for anticoagulation Outcome: Progressing Goal: 02 sats trending upward/stable (PE) Outcome: Progressing   Problem: Discharge Progression Outcomes Goal: Barriers To Progression Addressed/Resolved Outcome: Progressing Goal: Pain controlled with appropriate interventions Outcome: Progressing Goal: Complications resolved/controlled Outcome: Progressing

## 2018-12-02 NOTE — Progress Notes (Signed)
Ch visited w/ pt briefly before CSW arrived. Ch informed the pt that she was there to speak w/ pt and daughter present about a H-POA and Living Will. Pt was not clear on the difference yet ch shared that she would be willing to go over the AD so that the pt could be aware. Pt was mobile and able to ambulate to [facility] w/o any issues. Pt showed a flat affect but ch could tell that the pt was still highly medicated. Ch allowed time for CSW to visit and would f/u at a later time.     12/02/18 1005  Clinical Encounter Type  Visit Type Psychological support;Spiritual support;Social support;Initial  Referral From Physician  Consult/Referral To Chaplain  Spiritual Encounters  Spiritual Needs Emotional  Stress Factors  Patient Stress Factors Exhausted;Family relationships;Health changes;Financial concerns;Major life changes  Family Stress Factors Family relationships;Financial concerns;Loss of control  Advance Directives (For Healthcare)  Would patient like information on creating a medical advance directive? Yes (Inpatient - patient defers creating a medical advance directive at this time)

## 2018-12-02 NOTE — Progress Notes (Signed)
Diana Harvey   DOB:31-Oct-1970   IW#:[id number]    Subjective: Patient had fever up to 101 last night.  Chest pain improved not resolved.  Continues have shortness of breath on exertion.  Needing 3 L of oxygen.  Patient status post thrombolyzes on 2/12.  Otherwise uneventful.  Objective:  Vitals:   12/02/18 0756 12/02/18 1637  BP: 118/68 110/77  Pulse: (!) 120 96  Resp:    Temp: 98.7 F (37.1 C) 98.3 F (36.8 C)  SpO2: 92% 100%     Intake/Output Summary (Last 24 hours) at 12/02/2018 1956 Last data filed at 12/02/2018 1900 Gross per 24 hour  Intake 1907.49 ml  Output 2650 ml  Net -742.51 ml    Physical Exam  Constitutional: She is oriented to person, place, and time and well-developed, well-nourished, and in no distress.  3 L nasal cannula oxygen.  Accompanied by daughter.  HENT:  Head: Normocephalic and atraumatic.  Mouth/Throat: Oropharynx is clear and moist. No oropharyngeal exudate.  Eyes: Pupils are equal, round, and reactive to light.  Neck: Normal range of motion. Neck supple.  Cardiovascular: Normal rate and regular rhythm.  Pulmonary/Chest: No respiratory distress. She has no wheezes.  Abdominal: Soft. Bowel sounds are normal. She exhibits no distension and no mass. There is no abdominal tenderness. There is no rebound and no guarding.  Musculoskeletal: Normal range of motion.        General: No tenderness or edema.  Neurological: She is alert and oriented to person, place, and time.  Skin: Skin is warm.  Psychiatric: Affect normal.     Labs:  Lab Results  Component Value Date   WBC 10.8 (H) 12/02/2018   HGB 11.4 (L) 12/02/2018   HCT 35.0 (L) 12/02/2018   MCV 85.2 12/02/2018   PLT 219 12/02/2018    Lab Results  Component Value Date   NA 136 12/01/2018   K 3.0 (L) 12/02/2018   CL 97 (L) 12/01/2018   CO2 32 12/01/2018    Studies:  No results found.  Pulmonary embolism, bilateral (HCC) #48 year old female patient with no prior history of blood clots;  active smoker is currently in the hospital for worsening shortness of breath chest pain-diagnosed with bilateral PE.  #Bilateral large Pulmonary emboli- s/p thrombolysis on 2/12. Currently on IV heparin; when clinically stable- recommend transition to NOACs at discharge [eliquis or xarelto].  Patient still needing 3 L of oxygen saturation 92%  #Etiology of hypercoagulable state is unclear-hypercoagulable work-pending.   # Fever- ? Sec to PE- defer to primary team.   #Active smoker-again discussed with patient the importance of quitting smoking; counseled at length.   # follow up with me in cancer center in 1 week post discharge.      Earna Coder, MD 12/02/2018  7:56 PM

## 2018-12-02 NOTE — Progress Notes (Signed)
Pharmacy Antibiotic Note  Diana Harvey is a 48 y.o. female admitted on 11/30/2018 with sepsis.  Pharmacy has been consulted for vanc/cefepime dosing.  Plan: Patient received vanc 1.5g IV load Vancomycin 750 mg IV Q 12 hrs. Goal AUC 400-550. Expected AUC: 506.9 SCr used: 1.21 mg/dL Css trough: 19.7 mcg/mL  Will continue cefepime 2g IV q12h per CrCl bordering < 60 ml/min. Will continue to monitor renal fx. Patient might be spiking a post-op fever will continue to monitor.  Height: 5\' 7"  (170.2 cm) Weight: 180 lb 3.2 oz (81.7 kg) IBW/kg (Calculated) : 61.6  Temp (24hrs), Avg:99.2 F (37.3 C), Min:97.7 F (36.5 C), Max:101.3 F (38.5 C)  Recent Labs  Lab 11/30/18 1129 12/01/18 0214 12/02/18 0209  WBC 13.5* 11.1* 10.8*  CREATININE 0.93 1.21*  --     Estimated Creatinine Clearance: 62.5 mL/min (A) (by C-G formula based on SCr of 1.21 mg/dL (H)).    No Known Allergies  Thank you for allowing pharmacy to be a part of this patient's care.  Thomasene Ripple, PharmD, BCPS Clinical Pharmacist 12/02/2018

## 2018-12-02 NOTE — Progress Notes (Signed)
Pharmacy Antibiotic Note  Diana Harvey is a 48 y.o. female admitted on 11/30/2018 with sepsis.  Pharmacy has been consulted for vanc/cefepime dosing.  Plan: Patient received vanc 1.5g IV load. Vancomycin  1000 mg IV Q 24 hrs. Goal AUC 400-550. BMI > 30 Expected AUC: 456 SCr used: 1.21 mg/dL Css trough: 69.7 mcg/mL  Will continue cefepime 2g IV q12h per CrCl bordering < 60 ml/min. Will continue to monitor renal fx. Patient might be spiking a post-op fever will continue to monitor.  2/13 Bcx: pending.   Height: 5\' 7"  (170.2 cm) Weight: 192 lb 3.9 oz (87.2 kg) IBW/kg (Calculated) : 61.6  Temp (24hrs), Avg:99.3 F (37.4 C), Min:97.7 F (36.5 C), Max:101.3 F (38.5 C)  Recent Labs  Lab 11/30/18 1129 12/01/18 0214 12/02/18 0209  WBC 13.5* 11.1* 10.8*  CREATININE 0.93 1.21*  --     Estimated Creatinine Clearance: 64.4 mL/min (A) (by C-G formula based on SCr of 1.21 mg/dL (H)).    No Known Allergies  Thank you for allowing pharmacy to be a part of this patient's care.  Paschal Dopp, PharmD, BCPS Clinical Pharmacist 12/02/2018

## 2018-12-02 NOTE — Progress Notes (Signed)
  Ch f/u w/ pt in regards to the H-POA and Living Will. Pt decided to complete at another time. Ch provided pt w/ info on why the document is important yet pt did not want to rush to complete document at this time. Pt seems to be under significant distress relate to financial and health challenges. 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 Pt shared that she suffers from anemia. Ch provided info for pt and daughter about [facility]. Ch asked if additional needs were needed and pt shared that she just learned about her daughter being 8 months pregnant. Ch provided pt w/ a list of local social service organizations and shared that they would check w/ another ch about local resources for the family. Ch consulted w/ CSW and CM regarding the pt and her existential circumstances. Ch educated pt on AD and OR marked as completed.       12/02/18 1400  Clinical Encounter Type  Visited With Patient and family together  Visit Type Follow-up;Psychological support;Social support;Spiritual support  Referral From Physician  Consult/Referral To Chaplain  Spiritual Encounters  Spiritual Needs Emotional;Grief support  Stress Factors  Patient Stress Factors Exhausted;Family relationships;Financial concerns;Health changes;Loss of control;Major life changes  Family Stress Factors Major life changes;Family relationships;Exhausted;Health changes;Loss of control;Loss  Advance Directives (For Healthcare)  Does Patient Have a Medical Advance Directive? No  Would patient like information on creating a medical advance directive? Yes (Inpatient - patient defers creating a medical advance directive at this time)

## 2018-12-02 NOTE — Care Management (Signed)
Provided patient with Eliquis coupon.  She has medicaid and [facility].

## 2018-12-02 NOTE — Progress Notes (Signed)
[employer] - [place] at [facility]     PATIENT NAME: Diana Harvey    MR#:  [id number]  DATE OF BIRTH:  1971/05/14  SUBJECTIVE:   Patient here due to shortness of and chest pain from pulmonary embolism.  A bit lethargic and encephalopathic this morning due to pain medications and Geodon she received last night.  No other acute events overnight.  REVIEW OF SYSTEMS:    Review of Systems  Constitutional: Negative for chills and fever.  HENT: Negative for congestion and tinnitus.   Eyes: Negative for blurred vision and double vision.  Respiratory: Positive for shortness of breath. Negative for cough and wheezing.   Cardiovascular: Positive for chest pain. Negative for orthopnea and PND.  Gastrointestinal: Negative for abdominal pain, diarrhea, nausea and vomiting.  Genitourinary: Negative for dysuria and hematuria.  Neurological: Negative for dizziness, sensory change and focal weakness.  All other systems reviewed and are negative.   Nutrition: NPO for procedure Tolerating Diet: No Tolerating PT: Ambulatory  DRUG ALLERGIES:  No Known Allergies  VITALS:  Blood pressure 118/68, pulse (!) 120, temperature 98.7 F (37.1 C), temperature source Oral, resp. rate (!) 24, height 5\' 7"  (1.702 m), weight 87.2 kg, last menstrual period 11/22/2018, SpO2 92 %.  PHYSICAL EXAMINATION:   Physical Exam  GENERAL:  48 y.o.-year-old patient lying in bed lethargic/encephalopathic.  EYES: Pupils equal, round, reactive to light and accommodation. No scleral icterus. Extraocular muscles intact.  HEENT: Head atraumatic, normocephalic. Oropharynx and nasopharynx clear.  NECK:  Supple, no jugular venous distention. No thyroid enlargement, no tenderness.  LUNGS: Poor resp. effort, no wheezing, rales, rhonchi. No use of accessory muscles of respiration.  CARDIOVASCULAR: S1, S2 normal. No murmurs, rubs, or gallops.  ABDOMEN: Soft, nontender, nondistended. Bowel sounds present. No  organomegaly or mass.  EXTREMITIES: No cyanosis, clubbing or edema b/l.    NEUROLOGIC: Cranial nerves II through XII are intact. No focal Motor or sensory deficits b/l.   PSYCHIATRIC: The patient is alert and oriented x 3.  SKIN: No obvious rash, lesion, or ulcer.    LABORATORY PANEL:   CBC Recent Labs  Lab 12/02/18 0209  WBC 10.8*  HGB 11.4*  HCT 35.0*  PLT 219   ------------------------------------------------------------------------------------------------------------------  Chemistries  Recent Labs  Lab 11/30/18 1129  12/01/18 0214 12/02/18 0209  NA 136  --  136  --   K 3.1*  --  3.1* 3.0*  CL 97*  --  97*  --   CO2 29  --  32  --   GLUCOSE 164*  --  142*  --   BUN 11  --  17  --   CREATININE 0.93  --  1.21*  --   CALCIUM 9.5  --  8.7*  --   MG  --    < > 2.1 2.3  AST 12*  --   --   --   ALT 10  --   --   --   ALKPHOS 70  --   --   --   BILITOT 0.8  --   --   --    < > = values in this interval not displayed.   ------------------------------------------------------------------------------------------------------------------  Cardiac Enzymes Recent Labs  Lab 11/30/18 1129  TROPONINI <0.03   ------------------------------------------------------------------------------------------------------------------  RADIOLOGY:  US Venous Img Lower Bilateral  Result Date: 11/30/2018 CLINICAL DATA:  Acute pulmonary embolus by chest CTA EXAM: BILATERAL LOWER EXTREMITY VENOUS DOPPLER ULTRASOUND TECHNIQUE: Gray-scale sonography with graded compression, as well  as color Doppler and duplex ultrasound were performed to evaluate the lower extremity deep venous systems from the level of the common femoral vein and including the common femoral, femoral, profunda femoral, popliteal and calf veins including the posterior tibial, peroneal and gastrocnemius veins when visible. The superficial great saphenous vein was also interrogated. Spectral Doppler was utilized to evaluate flow at  rest and with distal augmentation maneuvers in the common femoral, femoral and popliteal veins. COMPARISON:  None. FINDINGS: RIGHT LOWER EXTREMITY Common Femoral Vein: No evidence of thrombus. Normal compressibility, respiratory phasicity and response to augmentation. Saphenofemoral Junction: No evidence of thrombus. Normal compressibility and flow on color Doppler imaging. Profunda Femoral Vein: No evidence of thrombus. Normal compressibility and flow on color Doppler imaging. Femoral Vein: No evidence of thrombus. Normal compressibility, respiratory phasicity and response to augmentation. Popliteal Vein: No evidence of thrombus. Normal compressibility, respiratory phasicity and response to augmentation. Calf Veins: No evidence of thrombus. Normal compressibility and flow on color Doppler imaging. Superficial Great Saphenous Vein: No evidence of thrombus. Normal compressibility. Venous Reflux:  None. Other Findings:  None. LEFT LOWER EXTREMITY Common Femoral Vein: No evidence of thrombus. Normal compressibility, respiratory phasicity and response to augmentation. Saphenofemoral Junction: No evidence of thrombus. Normal compressibility and flow on color Doppler imaging. Profunda Femoral Vein: No evidence of thrombus. Normal compressibility and flow on color Doppler imaging. Femoral Vein: No evidence of thrombus. Normal compressibility, respiratory phasicity and response to augmentation. Popliteal Vein: No evidence of thrombus. Normal compressibility, respiratory phasicity and response to augmentation. Calf Veins: No evidence of thrombus. Normal compressibility and flow on color Doppler imaging. Superficial Great Saphenous Vein: No evidence of thrombus. Normal compressibility. Venous Reflux:  None. Other Findings:  None. IMPRESSION: No evidence of deep venous thrombosis. Electronically Signed   By: Judie PetitM.  Shick M.D.   On: 11/30/2018 15:29     ASSESSMENT AND PLAN:   48 year old female with past medical history of  sickle cell anemia, diabetes who presented to the hospital due to shortness of breath and chest pain and noted to have a pulmonary embolism.  1.  Bilateral pulmonary embolism right greater than left- source of patient's shortness of breath and chest pain. - Dopplers are negative for DVT. - Thrombophilia work-up has been done and results are pending.   We will switch from IV heparin drip to Eliquis today.  Patient is status post thrombolysis and partial thrombectomy yesterday.  Improving, denies any worsening chest pain or shortness of breath today.  2.  Depression-continue Cymbalta.  3.  Diabetes type 2 without complication-continue sliding scale insulin. BS stable.   4.  HyperLipidemia-continue Zocor.  5. Hypokalemia - cont. To supplement and repeat in a.m. tomorrow.  - check Mg. Level.   6.  Lethargy/encephalopathy-secondary to pain meds. - cont. To monitor.   7.  Sepsis-patient rules in given her tachycardia and fever yesterday.  Source remains unclear I suspect this could all be from her PE.  Will get urinalysis, chest x-ray was negative on admission. -Patient was given empiric vancomycin, Zosyn.  MRSA PCR negative.  DC vancomycin.  Continue cefepime.  Follow cultures.  Follow fever curve.   All the records are reviewed and case discussed with Care Management/Social Worker. Management plans discussed with the patient, family and they are in agreement.  CODE STATUS: full code  DVT Prophylaxis: Eliquis  TOTAL TIME TAKING CARE OF THIS PATIENT: 30 minutes.   POSSIBLE D/C IN 1-2 DAYS, DEPENDING ON CLINICAL CONDITION.   Houston SirenSAINANI,Doni Widmer J M.D on  12/02/2018 at 3:13 PM  Between 7am to 6pm - Pager - [phone]  After 6pm go to [email or web address] - Therapist, [facility] Hospitalists  Office  [phone]  CC: Primary care physician; Center, [facility]

## 2018-12-03 ENCOUNTER — Telehealth: Payer: Self-pay | Admitting: Internal Medicine

## 2018-12-03 LAB — CBC
HCT: 29 % — ABNORMAL LOW (ref 36.0–46.0)
Hemoglobin: 9.7 g/dL — ABNORMAL LOW (ref 12.0–15.0)
MCH: 28.4 pg (ref 26.0–34.0)
MCHC: 33.4 g/dL (ref 30.0–36.0)
MCV: 85 fL (ref 80.0–100.0)
Platelets: 200 10*3/uL (ref 150–400)
RBC: 3.41 MIL/uL — AB (ref 3.87–5.11)
RDW: 12.6 % (ref 11.5–15.5)
WBC: 9.3 10*3/uL (ref 4.0–10.5)
nRBC: 0 % (ref 0.0–0.2)

## 2018-12-03 LAB — GLUCOSE, CAPILLARY
GLUCOSE-CAPILLARY: 229 mg/dL — AB (ref 70–99)
Glucose-Capillary: 151 mg/dL — ABNORMAL HIGH (ref 70–99)
Glucose-Capillary: 131 mg/dL — ABNORMAL HIGH (ref 70–99)

## 2018-12-03 LAB — FACTOR 5 LEIDEN

## 2018-12-03 MED ORDER — APIXABAN 5 MG PO TABS: ORAL_TABLET | ORAL | 1 refills | Status: AC

## 2018-12-03 NOTE — Progress Notes (Signed)
Pharmacy Antibiotic Note  Diana Harvey is a 48 y.o. female admitted on 11/30/2018 with sepsis.  Pharmacy has been consulted for vanc/cefepime dosing.  Plan: Will continue cefepime 2g IV q12h per CrCl bordering < 60 ml/min. Will continue to monitor renal fx. Patient might be spiking a post-op fever will continue to monitor.  2/13 Bcx: pending NGTD  Height: 5\' 7"  (170.2 cm) Weight: 192 lb 3.9 oz (87.2 kg) IBW/kg (Calculated) : 61.6  Temp (24hrs), Avg:98.4 F (36.9 C), Min:98.3 F (36.8 C), Max:98.4 F (36.9 C)  Recent Labs  Lab 11/30/18 1129 12/01/18 0214 12/02/18 0209 12/03/18 0501  WBC 13.5* 11.1* 10.8* 9.3  CREATININE 0.93 1.21*  --   --     Estimated Creatinine Clearance: 64.4 mL/min (A) (by C-G formula based on SCr of 1.21 mg/dL (H)).    No Known Allergies  Thank you for allowing pharmacy to be a part of this patient's care.  Paschal Dopp, PharmD, BCPS Clinical Pharmacist 12/03/2018

## 2018-12-03 NOTE — Clinical Social Work Note (Signed)
CSW was informed that patient does not have any transportation to get back home.  Patient states she normally uses [employer], CSW contacted [employer] Transportation to see if they would be able pick patient up, Medicaid transporation said they are not able to fit patient in.  She has tried to contact friends and family to see if anyone is able to see her, and unfortunatly there is not.  CSW provided patient a cab voucher to return back home.  CSW to sign off, please reconsult if social work needs arise.  Ervin Knack. Tawan Corkern, MSW, Theresia Majors [phone]  12/03/2018 3:15 PM

## 2018-12-03 NOTE — Telephone Encounter (Signed)
Recommend follow-up in the cancer center MD- /CBC BMP-1 week. Dx: Bilateral PE

## 2018-12-03 NOTE — Progress Notes (Signed)
Diana Harvey   DOB:12-12-1970   JJ#:[id number]    Subjective: Patient denies any abdominal pain flank pain nausea vomiting.  Continues to shortness of breath especially exertion.  Still needing oxygen.  No fevers.   Objective:  Vitals:   12/03/18 0518 12/03/18 0734  BP: 109/64 (!) 117/48  Pulse: 80 99  Resp: 18 19  Temp: 98.3 F (36.8 C) 98.4 F (36.9 C)  SpO2: 100% 95%     Intake/Output Summary (Last 24 hours) at 12/03/2018 0160 Last data filed at 12/03/2018 0153 Gross per 24 hour  Intake 720 ml  Output 1700 ml  Net -980 ml    Physical Exam  Constitutional: She is oriented to person, place, and time and well-developed, well-nourished, and in no distress.  3 L nasal cannula oxygen.  Accompanied by daughter.   HENT:  Head: Normocephalic and atraumatic.  Mouth/Throat: Oropharynx is clear and moist. No oropharyngeal exudate.  Eyes: Pupils are equal, round, and reactive to light.  Neck: Normal range of motion. Neck supple.  Cardiovascular: Normal rate and regular rhythm.  Pulmonary/Chest: No respiratory distress. She has no wheezes.  Abdominal: Soft. Bowel sounds are normal. She exhibits no distension and no mass. There is no abdominal tenderness. There is no rebound and no guarding.  Musculoskeletal: Normal range of motion.        General: No tenderness or edema.  Neurological: She is alert and oriented to person, place, and time.  Skin: Skin is warm.  Psychiatric: Affect normal.       Labs:  Lab Results  Component Value Date   WBC 9.3 12/03/2018   HGB 9.7 (L) 12/03/2018   HCT 29.0 (L) 12/03/2018   MCV 85.0 12/03/2018   PLT 200 12/03/2018    Lab Results  Component Value Date   NA 136 12/01/2018   K 3.0 (L) 12/02/2018   CL 97 (L) 12/01/2018   CO2 32 12/01/2018    Studies:  No results found.  Pulmonary embolism, bilateral (HCC) #48 year old female patient unprovoked bilateral PE.  #Bilateral large Pulmonary emboli- s/p thrombolysis on 2/12.  Currently  switched over to Eliquis.  Patient still needing 2 to 3 L of oxygen.  Again discussed the importance of compliance with Eliquis.  Again counseled regarding falls at length.  #Etiology of hypercoagulable state is unclear-hypercoagulable work-pending.   # Fever-likely secondary to PE.  Resolved  #Anemia hemoglobin 9.7-about 4 g drop from baseline.  But no obvious evidence of any abdominal bleed or GI bleed.  This needs to be monitored closely.  #Disposition: Recommend follow-up in 1 week.   Earna Coder, MD 12/03/2018  8:03 AM

## 2018-12-03 NOTE — Progress Notes (Signed)
Discharged to home with daughter via [facility].  She has an eliquis prescription and coupon for the drug.

## 2018-12-03 NOTE — Progress Notes (Signed)
   12/03/18 0600  Clinical Encounter Type  Visited With Patient and family together  Visit Type Follow-up;Spiritual support  Referral From Nurse  Spiritual Encounters  Spiritual Needs Emotional;Other (Comment)  Stress Factors  Patient Stress Factors Exhausted;Loss  Family Stress Factors Loss  Advance Directives (For Healthcare)  Does Patient Have a Medical Advance Directive? No  Would patient like information on creating a medical advance directive? Yes (Inpatient - patient requests chaplain consult to create a medical advance directive)

## 2018-12-03 NOTE — Discharge Summary (Signed)
[facility] [place] at [facility]   PATIENT NAME: Diana Harvey    MR#:  [id number]  DATE OF BIRTH:  November 14, 1970  DATE OF ADMISSION:  11/30/2018 ADMITTING PHYSICIAN: Enedina Finner, MD  DATE OF DISCHARGE: 12/03/2018  PRIMARY CARE PHYSICIAN: [facility]    ADMISSION DIAGNOSIS:  Pulmonary embolism (HCC) [I26.99]  DISCHARGE DIAGNOSIS:  Active Problems:   Pulmonary embolism, bilateral (HCC)   SECONDARY DIAGNOSIS:   Past Medical History:  Diagnosis Date  . Diabetes mellitus without complication (HCC)   . Sickle cell anemia [facility])     HOSPITAL COURSE:   48 year old female with past medical history of sickle cell anemia, diabetes who presented to the hospital due to shortness of breath and chest pain and noted to have a pulmonary embolism.  1.  Bilateral pulmonary embolism right greater than left- source of patient's shortness of breath and chest pain. - Dopplers were negative for DVT. - Thrombophilia work-up has been done and results are pending.   -Initially treated with IV heparin drip, seen by vascular surgery underwent thrombolysis and partial thrombectomy of the pulmonary embolism.  Post procedure patient has had no hemoptysis.  Hypoxia has improved.  She denies any worsening chest pain. - She was switched over to oral Eliquis and now being discharged on that.  2.  Depression- she will continue Cymbalta.  3.  Diabetes type 2 without complication- while in the hospital patient was on sliding scale insulin and now will resume her Invokana  4.  HyperLipidemia- pt. Will continue Zocor.  5. Hypokalemia - improved and resolved with supplementation.   6.  Lethargy/encephalopathy-secondary to pain meds. - resolved now.    7.    Fever of unknown origin-patient developed fevers 48 hours prior to discharge.  This is likely the patient's pulmonary embolism.  Patient was empirically given some IV antibiotics but the cultures remain  negative, her fevers come down with Tylenol.  Her urinalysis is negative.  She is clinically asymptomatic now.  She will be discharged home.  DISCHARGE CONDITIONS:   Stable  CONSULTS OBTAINED:  Treatment Team:  Earna Coder, MD  DRUG ALLERGIES:  No Known Allergies  DISCHARGE MEDICATIONS:   Allergies as of 12/03/2018   No Known Allergies     Medication List    TAKE these medications   apixaban 5 MG Tabs tablet Commonly known as:  ELIQUIS 10 mg PO BID X 5 days, then start 5 mg PO BID Thereafter. Notes to patient:  Follow directions carefully   aspirin EC 81 MG tablet Take 81 mg by mouth daily.   DULoxetine 60 MG capsule Commonly known as:  CYMBALTA Take 60 mg by mouth every evening.   INVOKANA 100 MG Tabs tablet Generic drug:  canagliflozin Take 100 mg by mouth daily before breakfast.   naproxen 500 MG tablet Commonly known as:  NAPROSYN Take 1 tablet (500 mg total) by mouth 2 (two) times daily with a meal.   simvastatin 40 MG tablet Commonly known as:  ZOCOR Take 40 mg by mouth daily.   ziprasidone 80 MG capsule Commonly known as:  GEODON Take 80 mg by mouth at bedtime.   zolpidem 10 MG tablet Commonly known as:  AMBIEN Take 10 mg by mouth at bedtime as needed for sleep.         DISCHARGE INSTRUCTIONS:   DIET:  Cardiac diet and Diabetic diet  DISCHARGE CONDITION:  Stable  ACTIVITY:  Activity as tolerated  OXYGEN:  Home  Oxygen: No.   Oxygen Delivery: room air  DISCHARGE LOCATION:  home   If you experience worsening of your admission symptoms, develop shortness of breath, life threatening emergency, suicidal or homicidal thoughts you must seek medical attention immediately by calling 911 or calling your MD immediately  if symptoms less severe.  You Must read complete instructions/literature along with all the possible adverse reactions/side effects for all the Medicines you take and that have been prescribed to you. Take any new  Medicines after you have completely understood and accpet all the possible adverse reactions/side effects.   Please note  You were cared for by a hospitalist during your hospital stay. If you have any questions about your discharge medications or the care you received while you were in the hospital after you are discharged, you can call the unit and asked to speak with the hospitalist on call if the hospitalist that took care of you is not available. Once you are discharged, your primary care physician will handle any further medical issues. Please note that NO REFILLS for any discharge medications will be authorized once you are discharged, as it is imperative that you return to your primary care physician (or establish a relationship with a primary care physician if you do not have one) for your aftercare needs so that they can reassess your need for medications and monitor your lab values.     Today   No acute events overnight.  Shortness of breath, chest pain has resolved.  Patient be discharged on oral Eliquis today.  VITAL SIGNS:  Blood pressure (!) 117/48, pulse 99, temperature 98.4 F (36.9 C), temperature source Oral, resp. rate 19, height 5\' 7"  (1.702 m), weight 87.2 kg, last menstrual period 11/22/2018, SpO2 95 %.  I/O:    Intake/Output Summary (Last 24 hours) at 12/03/2018 1431 Last data filed at 12/03/2018 1347 Gross per 24 hour  Intake 720 ml  Output 1700 ml  Net -980 ml    PHYSICAL EXAMINATION:  GENERAL:  48 y.o.-year-old patient lying in the bed with no acute distress.  EYES: Pupils equal, round, reactive to light and accommodation. No scleral icterus. Extraocular muscles intact.  HEENT: Head atraumatic, normocephalic. Oropharynx and nasopharynx clear.  NECK:  Supple, no jugular venous distention. No thyroid enlargement, no tenderness.  LUNGS: Normal breath sounds bilaterally, no wheezing, rales,rhonchi. No use of accessory muscles of respiration.  CARDIOVASCULAR: S1,  S2 normal. No murmurs, rubs, or gallops.  ABDOMEN: Soft, non-tender, non-distended. Bowel sounds present. No organomegaly or mass.  EXTREMITIES: No pedal edema, cyanosis, or clubbing.  NEUROLOGIC: Cranial nerves II through XII are intact. No focal motor or sensory defecits b/l.  PSYCHIATRIC: The patient is alert and oriented x 3. SKIN: No obvious rash, lesion, or ulcer.   DATA REVIEW:   CBC Recent Labs  Lab 12/03/18 0501  WBC 9.3  HGB 9.7*  HCT 29.0*  PLT 200    Chemistries  Recent Labs  Lab 11/30/18 1129  12/01/18 0214 12/02/18 0209  NA 136  --  136  --   K 3.1*  --  3.1* 3.0*  CL 97*  --  97*  --   CO2 29  --  32  --   GLUCOSE 164*  --  142*  --   BUN 11  --  17  --   CREATININE 0.93  --  1.21*  --   CALCIUM 9.5  --  8.7*  --   MG  --    < >  2.1 2.3  AST 12*  --   --   --   ALT 10  --   --   --   ALKPHOS 70  --   --   --   BILITOT 0.8  --   --   --    < > = values in this interval not displayed.    Cardiac Enzymes Recent Labs  Lab 11/30/18 1129  TROPONINI <0.03    Microbiology Results  Results for orders placed or performed during the hospital encounter of 11/30/18  Culture, blood (single) w Reflex to ID Panel     Status: None (Preliminary result)   Collection Time: 12/02/18  2:02 AM  Result Value Ref Range Status   Specimen Description BLOOD RIGHT ASSIST CONTROL  Final   Special Requests   Final    BOTTLES DRAWN AEROBIC AND ANAEROBIC Blood Culture adequate volume   Culture   Final    NO GROWTH 1 DAY Performed at [facility], [address] [place], [place] [address]    Report Status PENDING  Incomplete    RADIOLOGY:  No results found.    Management plans discussed with the patient, family and they are in agreement.  CODE STATUS:     Code Status Orders  (From admission, onward)         Start     Ordered   11/30/18 1718  Full code  Continuous     11/30/18 1717        Code Status History    This patient has a current  code status but no historical code status.      TOTAL TIME TAKING CARE OF THIS PATIENT: 40 minutes.    Houston SirenSAINANI,Ceara Wrightson J M.D on 12/03/2018 at 2:31 PM  Between 7am to 6pm - Pager - [phone]  After 6pm go to [email or web address] - Therapist, nutritionalpassword EPAS [facility]  [facility]  Office  [phone]  CC: Primary care physician; [facility]

## 2018-12-06 LAB — PROTHROMBIN GENE MUTATION

## 2018-12-07 ENCOUNTER — Telehealth: Payer: Self-pay

## 2018-12-07 LAB — CULTURE, BLOOD (SINGLE)
Culture: NO GROWTH
Special Requests: ADEQUATE

## 2018-12-07 NOTE — Telephone Encounter (Signed)
Flagged on EMMI report for not reading discharge papers and having unfilled prescriptions.  Called and spoke with patient.  She plans to fill her prescription today.  She does have a copy of her discharge papers to reference as needed.  No other questions or concerns.  I thanked her for her time and informed her she would receive one more automated call checking in over the next few days.

## 2018-12-09 ENCOUNTER — Other Ambulatory Visit: Payer: Self-pay | Admitting: Internal Medicine

## 2018-12-09 DIAGNOSIS — I2699 Other pulmonary embolism without acute cor pulmonale: Secondary | ICD-10-CM

## 2018-12-10 ENCOUNTER — Inpatient Hospital Stay: Payer: Medicaid Other | Admitting: Internal Medicine

## 2018-12-10 ENCOUNTER — Inpatient Hospital Stay: Payer: Medicaid Other

## 2018-12-10 NOTE — Progress Notes (Deleted)
[facility] CONSULT NOTE  Patient Care Team: [facility], [facility] as PCP - General (General Practice)  CHIEF COMPLAINTS/PURPOSE OF CONSULTATION:  ***  #   No history exists.     HISTORY OF PRESENTING ILLNESS:  Diana Harvey 48 y.o.  female    ROS   MEDICAL HISTORY:  Past Medical History:  Diagnosis Date  . Diabetes mellitus without complication (HCC)   . Sickle cell anemia (HCC)     SURGICAL HISTORY: Past Surgical History:  Procedure Laterality Date  . PULMONARY THROMBECTOMY Bilateral 12/01/2018   Procedure: BILATERAL PULMONARY THROMBECTOMY WITH POSSIBLE BILATERAL ILEO-FEMORAL VENOUS LYSIS WITH IVC FILTER PLACEMENT;  Surgeon: Renford Dills, MD;  Location: [facility] INVASIVE CV LAB;  Service: Cardiovascular;  Laterality: Bilateral;    SOCIAL HISTORY: Social History   Socioeconomic History  . Marital status: Single    Spouse name: Not on file  . Number of children: Not on file  . Years of education: Not on file  . Highest education level: Not on file  Occupational History  . Not on file  Social Needs  . Financial resource strain: Not on file  . Food insecurity:    Worry: Not on file    Inability: Not on file  . Transportation needs:    Medical: Not on file    Non-medical: Not on file  Tobacco Use  . Smoking status: Current Every Day Smoker  . Smokeless tobacco: Never Used  Substance and Sexual Activity  . Alcohol use: No  . Drug use: Not Currently  . Sexual activity: Not Currently  Lifestyle  . Physical activity:    Days per week: Not on file    Minutes per session: Not on file  . Stress: Not on file  Relationships  . Social connections:    Talks on phone: Not on file    Gets together: Not on file    Attends religious service: Not on file    Active member of club or organization: Not on file    Attends meetings of clubs or organizations: Not on file    Relationship status: Not on file  . Intimate partner violence:     Fear of current or ex partner: Not on file    Emotionally abused: Not on file    Physically abused: Not on file    Forced sexual activity: Not on file  Other Topics Concern  . Not on file  Social History Narrative  . Not on file    FAMILY HISTORY: No family history on file.  ALLERGIES:  has No Known Allergies.  MEDICATIONS:  Current Outpatient Medications  Medication Sig Dispense Refill  . apixaban (ELIQUIS) 5 MG TABS tablet 10 mg PO BID X 5 days, then start 5 mg PO BID Thereafter. 90 tablet 1  . aspirin EC 81 MG tablet Take 81 mg by mouth daily.    . canagliflozin (INVOKANA) 100 MG TABS tablet Take 100 mg by mouth daily before breakfast.    . DULoxetine (CYMBALTA) 60 MG capsule Take 60 mg by mouth every evening.     . naproxen (NAPROSYN) 500 MG tablet Take 1 tablet (500 mg total) by mouth 2 (two) times daily with a meal. 30 tablet 0  . simvastatin (ZOCOR) 40 MG tablet Take 40 mg by mouth daily.    . ziprasidone (GEODON) 80 MG capsule Take 80 mg by mouth at bedtime.    Marland Kitchen zolpidem (AMBIEN) 10 MG tablet Take 10 mg by mouth  at bedtime as needed for sleep.     No current facility-administered medications for this visit.       Marland Kitchen.  PHYSICAL EXAMINATION: ECOG PERFORMANCE STATUS: {CHL ONC ECOG PS:[id number]}  There were no vitals filed for this visit. There were no vitals filed for this visit.  Physical Exam   LABORATORY DATA:  I have reviewed the data as listed Lab Results  Component Value Date   WBC 9.3 12/03/2018   HGB 9.7 (L) 12/03/2018   HCT 29.0 (L) 12/03/2018   MCV 85.0 12/03/2018   PLT 200 12/03/2018   Recent Labs    11/30/18 1129 12/01/18 0214 12/02/18 0209  NA 136 136  --   K 3.1* 3.1* 3.0*  CL 97* 97*  --   CO2 29 32  --   GLUCOSE 164* 142*  --   BUN 11 17  --   CREATININE 0.93 1.21*  --   CALCIUM 9.5 8.7*  --   GFRNONAA >60 53*  --   GFRAA >60 >60  --   PROT 8.6*  --   --   ALBUMIN 4.2  --   --   AST 12*  --   --   ALT 10  --   --   ALKPHOS 70   --   --   BILITOT 0.8  --   --   BILIDIR 0.1  --   --   IBILI 0.7  --   --     RADIOGRAPHIC STUDIES: I have personally reviewed the radiological images as listed and agreed with the findings in the report. Dg Chest 2 View  Result Date: 11/30/2018 CLINICAL DATA:  Chest pain and shortness of breath for 2 days. Sickle cell anemia. Smoker. EXAM: CHEST - 2 VIEW COMPARISON:  None. FINDINGS: Heart size is within normal limits. Elevation of right hemidiaphragm seen. Atelectasis or infiltrate is seen in both lung bases, right side greater than left. IMPRESSION: Bibasilar atelectasis versus infiltrates, right side greater than left. Electronically Signed   By: Myles RosenthalJohn  Stahl M.D.   On: 11/30/2018 12:20   Ct Angio Chest Pe W And/or Wo Contrast  Result Date: 11/30/2018 CLINICAL DATA:  Pt states increasing chest pain and SOB. Pt also states RUQ pain worsening over the last day. EXAM: CT ANGIOGRAPHY CHEST CT ABDOMEN AND PELVIS WITH CONTRAST TECHNIQUE: Multidetector CT imaging of the chest was performed using the standard protocol during bolus administration of intravenous contrast. Multiplanar CT image reconstructions and MIPs were obtained to evaluate the vascular anatomy. Multidetector CT imaging of the abdomen and pelvis was performed using the standard protocol during bolus administration of intravenous contrast. CONTRAST:  75mL OMNIPAQUE IOHEXOL 350 MG/ML SOLN COMPARISON:  11/30/2018 FINDINGS: CTA CHEST FINDINGS Cardiovascular: There are large pulmonary emboli involving the RIGHT main pulmonary artery extending into the UPPER and LOWER lobe arteries. Significant LEFT pulmonary embolus involves the LOWER lobe arteries and extends into LOWER lobe branches. No evidence for RIGHT heart strain. Incidental note is made of LEFT common carotid originating from the RIGHT brachiocephalic artery. Otherwise the thoracic aorta is normal. Mediastinum/Nodes: Small hiatal hernia. Esophagus other wise is normal. Thyroid is normal  in appearance. No significant adenopathy. Lungs/Pleura: There is bibasilar atelectasis. Consolidation at the RIGHT LOWER lobe may represent infarct. No suspicious pulmonary nodules. Musculoskeletal: No chest wall abnormality. No acute or significant osseous findings. Review of the MIP images confirms the above findings. CT ABDOMEN and PELVIS FINDINGS Hepatobiliary: No focal liver abnormality is seen. No radiopaque gallstones,  biliary dilatation, or pericholecystic inflammatory changes. Pancreas: Unremarkable. No pancreatic ductal dilatation or surrounding inflammatory changes. Spleen: Normal in size without focal abnormality. Adrenals/Urinary Tract: Adrenal glands are normal in appearance. Symmetric enhancement and excretion from both kidneys. No renal mass. Ureters are unremarkable. The bladder and visualized portion of the urethra are normal. Stomach/Bowel: Small hiatal hernia. Small bowel loops are normal in appearance. The appendix is well seen and is normal in caliber. Tiny appendicolith without evidence for acute appendicitis. Large bowel is unremarkable. Vascular/Lymphatic: The RIGHT common femoral artery is expanded and low attenuation, suspicious for deep vein thrombosis. There is slight heterogeneous appearance of the LEFT common femoral vein, possibly related to in flow. Consider thrombosis. No retroperitoneal or mesenteric adenopathy. Reproductive: The uterus is present. No adnexal mass. No free pelvic fluid. Other: No abdominal wall hernia or abnormality. No abdominopelvic ascites. Musculoskeletal: No acute or significant osseous findings. Review of the MIP images confirms the above findings. IMPRESSION: 1. Significant pulmonary emboli bilaterally. There is no evidence for RIGHT heart strain. 2. Bibasilar atelectasis; suspect consolidation or infarct in the RIGHT LOWER lobe. 3. Small hiatal hernia. 4. Normal appendix. 5. Suspect RIGHT femoral vein thrombosis. Possible LEFT femoral vein thrombosis.  Recommend further evaluation with bilateral LOWER extremity Doppler exams. Critical Value/emergent results were called by telephone at the time of interpretation on 11/30/2018 at 1:10 pm to Dr. Ileana Roup , who verbally acknowledged these results. Electronically Signed   By: Norva Pavlov M.D.   On: 11/30/2018 13:28   Ct Abdomen Pelvis W Contrast  Result Date: 11/30/2018 CLINICAL DATA:  Pt states increasing chest pain and SOB. Pt also states RUQ pain worsening over the last day. EXAM: CT ANGIOGRAPHY CHEST CT ABDOMEN AND PELVIS WITH CONTRAST TECHNIQUE: Multidetector CT imaging of the chest was performed using the standard protocol during bolus administration of intravenous contrast. Multiplanar CT image reconstructions and MIPs were obtained to evaluate the vascular anatomy. Multidetector CT imaging of the abdomen and pelvis was performed using the standard protocol during bolus administration of intravenous contrast. CONTRAST:  52mL OMNIPAQUE IOHEXOL 350 MG/ML SOLN COMPARISON:  11/30/2018 FINDINGS: CTA CHEST FINDINGS Cardiovascular: There are large pulmonary emboli involving the RIGHT main pulmonary artery extending into the UPPER and LOWER lobe arteries. Significant LEFT pulmonary embolus involves the LOWER lobe arteries and extends into LOWER lobe branches. No evidence for RIGHT heart strain. Incidental note is made of LEFT common carotid originating from the RIGHT brachiocephalic artery. Otherwise the thoracic aorta is normal. Mediastinum/Nodes: Small hiatal hernia. Esophagus other wise is normal. Thyroid is normal in appearance. No significant adenopathy. Lungs/Pleura: There is bibasilar atelectasis. Consolidation at the RIGHT LOWER lobe may represent infarct. No suspicious pulmonary nodules. Musculoskeletal: No chest wall abnormality. No acute or significant osseous findings. Review of the MIP images confirms the above findings. CT ABDOMEN and PELVIS FINDINGS Hepatobiliary: No focal liver abnormality  is seen. No radiopaque gallstones, biliary dilatation, or pericholecystic inflammatory changes. Pancreas: Unremarkable. No pancreatic ductal dilatation or surrounding inflammatory changes. Spleen: Normal in size without focal abnormality. Adrenals/Urinary Tract: Adrenal glands are normal in appearance. Symmetric enhancement and excretion from both kidneys. No renal mass. Ureters are unremarkable. The bladder and visualized portion of the urethra are normal. Stomach/Bowel: Small hiatal hernia. Small bowel loops are normal in appearance. The appendix is well seen and is normal in caliber. Tiny appendicolith without evidence for acute appendicitis. Large bowel is unremarkable. Vascular/Lymphatic: The RIGHT common femoral artery is expanded and low attenuation, suspicious for deep vein thrombosis.  There is slight heterogeneous appearance of the LEFT common femoral vein, possibly related to in flow. Consider thrombosis. No retroperitoneal or mesenteric adenopathy. Reproductive: The uterus is present. No adnexal mass. No free pelvic fluid. Other: No abdominal wall hernia or abnormality. No abdominopelvic ascites. Musculoskeletal: No acute or significant osseous findings. Review of the MIP images confirms the above findings. IMPRESSION: 1. Significant pulmonary emboli bilaterally. There is no evidence for RIGHT heart strain. 2. Bibasilar atelectasis; suspect consolidation or infarct in the RIGHT LOWER lobe. 3. Small hiatal hernia. 4. Normal appendix. 5. Suspect RIGHT femoral vein thrombosis. Possible LEFT femoral vein thrombosis. Recommend further evaluation with bilateral LOWER extremity Doppler exams. Critical Value/emergent results were called by telephone at the time of interpretation on 11/30/2018 at 1:10 pm to Dr. Ileana Roup , who verbally acknowledged these results. Electronically Signed   By: Norva Pavlov M.D.   On: 11/30/2018 13:28   US Venous Img Lower Bilateral  Result Date: 11/30/2018 CLINICAL DATA:   Acute pulmonary embolus by chest CTA EXAM: BILATERAL LOWER EXTREMITY VENOUS DOPPLER ULTRASOUND TECHNIQUE: Gray-scale sonography with graded compression, as well as color Doppler and duplex ultrasound were performed to evaluate the lower extremity deep venous systems from the level of the common femoral vein and including the common femoral, femoral, profunda femoral, popliteal and calf veins including the posterior tibial, peroneal and gastrocnemius veins when visible. The superficial great saphenous vein was also interrogated. Spectral Doppler was utilized to evaluate flow at rest and with distal augmentation maneuvers in the common femoral, femoral and popliteal veins. COMPARISON:  None. FINDINGS: RIGHT LOWER EXTREMITY Common Femoral Vein: No evidence of thrombus. Normal compressibility, respiratory phasicity and response to augmentation. Saphenofemoral Junction: No evidence of thrombus. Normal compressibility and flow on color Doppler imaging. Profunda Femoral Vein: No evidence of thrombus. Normal compressibility and flow on color Doppler imaging. Femoral Vein: No evidence of thrombus. Normal compressibility, respiratory phasicity and response to augmentation. Popliteal Vein: No evidence of thrombus. Normal compressibility, respiratory phasicity and response to augmentation. Calf Veins: No evidence of thrombus. Normal compressibility and flow on color Doppler imaging. Superficial Great Saphenous Vein: No evidence of thrombus. Normal compressibility. Venous Reflux:  None. Other Findings:  None. LEFT LOWER EXTREMITY Common Femoral Vein: No evidence of thrombus. Normal compressibility, respiratory phasicity and response to augmentation. Saphenofemoral Junction: No evidence of thrombus. Normal compressibility and flow on color Doppler imaging. Profunda Femoral Vein: No evidence of thrombus. Normal compressibility and flow on color Doppler imaging. Femoral Vein: No evidence of thrombus. Normal compressibility,  respiratory phasicity and response to augmentation. Popliteal Vein: No evidence of thrombus. Normal compressibility, respiratory phasicity and response to augmentation. Calf Veins: No evidence of thrombus. Normal compressibility and flow on color Doppler imaging. Superficial Great Saphenous Vein: No evidence of thrombus. Normal compressibility. Venous Reflux:  None. Other Findings:  None. IMPRESSION: No evidence of deep venous thrombosis. Electronically Signed   By: Judie Petit.  Shick M.D.   On: 11/30/2018 15:29    ASSESSMENT & PLAN:   No problem-specific Assessment & Plan notes found for this encounter.    All questions were answered. The patient knows to call the clinic with any problems, questions or concerns.       Earna Coder, MD 12/10/2018 10:16 AM

## 2018-12-21 ENCOUNTER — Inpatient Hospital Stay: Payer: Medicaid Other | Attending: Internal Medicine

## 2018-12-21 ENCOUNTER — Inpatient Hospital Stay (HOSPITAL_BASED_OUTPATIENT_CLINIC_OR_DEPARTMENT_OTHER): Payer: Medicaid Other | Admitting: Internal Medicine

## 2018-12-21 ENCOUNTER — Encounter: Payer: Self-pay | Admitting: Internal Medicine

## 2018-12-21 DIAGNOSIS — M549 Dorsalgia, unspecified: Secondary | ICD-10-CM | POA: Insufficient documentation

## 2018-12-21 DIAGNOSIS — I2699 Other pulmonary embolism without acute cor pulmonale: Secondary | ICD-10-CM | POA: Diagnosis present

## 2018-12-21 DIAGNOSIS — Z7984 Long term (current) use of oral hypoglycemic drugs: Secondary | ICD-10-CM | POA: Insufficient documentation

## 2018-12-21 DIAGNOSIS — E119 Type 2 diabetes mellitus without complications: Secondary | ICD-10-CM | POA: Diagnosis not present

## 2018-12-21 DIAGNOSIS — Z7901 Long term (current) use of anticoagulants: Secondary | ICD-10-CM | POA: Diagnosis not present

## 2018-12-21 DIAGNOSIS — F17210 Nicotine dependence, cigarettes, uncomplicated: Secondary | ICD-10-CM

## 2018-12-21 DIAGNOSIS — Z791 Long term (current) use of non-steroidal anti-inflammatories (NSAID): Secondary | ICD-10-CM | POA: Insufficient documentation

## 2018-12-21 DIAGNOSIS — Z79899 Other long term (current) drug therapy: Secondary | ICD-10-CM | POA: Insufficient documentation

## 2018-12-21 DIAGNOSIS — Z7982 Long term (current) use of aspirin: Secondary | ICD-10-CM

## 2018-12-21 DIAGNOSIS — F17200 Nicotine dependence, unspecified, uncomplicated: Secondary | ICD-10-CM | POA: Diagnosis not present

## 2018-12-21 LAB — CBC WITH DIFFERENTIAL/PLATELET
Abs Immature Granulocytes: 0.01 10*3/uL (ref 0.00–0.07)
Basophils Absolute: 0 10*3/uL (ref 0.0–0.1)
Basophils Relative: 1 %
Eosinophils Absolute: 0.1 10*3/uL (ref 0.0–0.5)
Eosinophils Relative: 2 %
HCT: 35.4 % — ABNORMAL LOW (ref 36.0–46.0)
HEMOGLOBIN: 11.6 g/dL — AB (ref 12.0–15.0)
Immature Granulocytes: 0 %
Lymphocytes Relative: 23 %
Lymphs Abs: 1.5 10*3/uL (ref 0.7–4.0)
MCH: 27.2 pg (ref 26.0–34.0)
MCHC: 32.8 g/dL (ref 30.0–36.0)
MCV: 82.9 fL (ref 80.0–100.0)
MONOS PCT: 6 %
Monocytes Absolute: 0.4 10*3/uL (ref 0.1–1.0)
Neutro Abs: 4.5 10*3/uL (ref 1.7–7.7)
Neutrophils Relative %: 68 %
Platelets: 357 10*3/uL (ref 150–400)
RBC: 4.27 MIL/uL (ref 3.87–5.11)
RDW: 13.8 % (ref 11.5–15.5)
WBC: 6.6 10*3/uL (ref 4.0–10.5)
nRBC: 0 % (ref 0.0–0.2)

## 2018-12-21 LAB — BASIC METABOLIC PANEL
Anion gap: 7 (ref 5–15)
BUN: 13 mg/dL (ref 6–20)
CO2: 25 mmol/L (ref 22–32)
Calcium: 8.8 mg/dL — ABNORMAL LOW (ref 8.9–10.3)
Chloride: 107 mmol/L (ref 98–111)
Creatinine, Ser: 0.96 mg/dL (ref 0.44–1.00)
GFR calc Af Amer: >60 mL/min (ref >60)
GFR calc non Af Amer: >60 mL/min (ref >60)
GLUCOSE: 156 mg/dL — AB (ref 70–99)
Potassium: 3.7 mmol/L (ref 3.5–5.1)
Sodium: 139 mmol/L (ref 135–145)

## 2018-12-21 NOTE — Assessment & Plan Note (Addendum)
#   BILATERAL PE--status post thrombolysis.  Unprovoked.  Recommend continued Eliquis 5 mg twice daily.  Hypercoagulable work-up negative so far.  Reviewed the hypercoagulable  #Had a long discussion with patient and family regarding the length of anticoagulation anywhere between 6 months to 1 year.   # Back pain-recommend follow-up with PCP recommend Tylenol as needed.  Avoid NSAIDs.  # 25 minutes face-to-face with the patient discussing the above plan of care; more than 50% of time spent on prognosis/ natural history; counseling and coordination.   # DISPOSITION: # follow up in 3 months- MD/labs- cbc/cmp- Dr.B  Dr.Aycock   

## 2018-12-21 NOTE — Progress Notes (Signed)
[facility] CONSULT NOTE  Patient Care Team: Center, [facility] as PCP - General (General Practice)  CHIEF COMPLAINTS/PURPOSE OF CONSULTATION:  BILATERAL PE  # FEB 2020- BILATERAL PE unprovoked. 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     HISTORY OF PRESENTING ILLNESS:  Diana Harvey 48 y.o.  female was recently admitted to hospital for bilateral PE.  Patient underwent thrombolysis; and discharged home on Eliquis.  Patient denies any blood in stools black or stools.  Complains of left-sided flank pain.  No fevers or chills.  Review of Systems  Constitutional: Negative for chills, diaphoresis, fever, malaise/fatigue and weight loss.  HENT: Negative for nosebleeds and sore throat.   Eyes: Negative for double vision.  Respiratory: Negative for cough, hemoptysis, sputum production, shortness of breath and wheezing.   Cardiovascular: Negative for chest pain, palpitations, orthopnea and leg swelling.  Gastrointestinal: Negative for abdominal pain, blood in stool, constipation, diarrhea, heartburn, melena, nausea and vomiting.  Genitourinary: Negative for dysuria, frequency and urgency.  Musculoskeletal: Positive for back pain and joint pain.  Skin: Negative.  Negative for itching and rash.  Neurological: Negative for dizziness, tingling, focal weakness, weakness and headaches.  Endo/Heme/Allergies: Does not bruise/bleed easily.  Psychiatric/Behavioral: Negative for depression. The patient is not nervous/anxious and does not have insomnia.      MEDICAL HISTORY:  Past Medical History:  Diagnosis Date  . Diabetes mellitus without complication (HCC)   . Sickle cell anemia (HCC)     SURGICAL HISTORY: Past Surgical History:  Procedure Laterality Date  . PULMONARY THROMBECTOMY Bilateral  12/01/2018   Procedure: BILATERAL PULMONARY THROMBECTOMY WITH POSSIBLE BILATERAL ILEO-FEMORAL VENOUS LYSIS WITH IVC FILTER PLACEMENT;  Surgeon: Renford Dills, MD;  Location: [facility] INVASIVE CV LAB;  Service: Cardiovascular;  Laterality: Bilateral;    SOCIAL HISTORY: Social History   Socioeconomic History  . Marital status: Single    Spouse name: Not on file  . Number of children: Not on file  . Years of education: Not on file  . Highest education level: Not on file  Occupational History  . Not on file  Social Needs  . Financial resource strain: Not on file  . Food insecurity:    Worry: Not on file    Inability: Not on file  . Transportation needs:    Medical: Not on file    Non-medical: Not on file  Tobacco Use  . Smoking status: Current Every Day Smoker  . Smokeless tobacco: Never Used  Substance and Sexual Activity  . Alcohol use: No  . Drug use: Not Currently  . Sexual activity: Not Currently  Lifestyle  . Physical activity:    Days per week: Not on file    Minutes per session: Not on file  . Stress: Not on file  Relationships  . Social connections:    Talks on phone: Not on file    Gets together: Not on file    Attends religious service: Not on file    Active member of club or organization: Not on file    Attends meetings of clubs or organizations: Not on file    Relationship status: Not on file  . Intimate partner violence:    Fear of current or ex partner: Not on file    Emotionally abused: Not on file    Physically abused: Not on file    Forced sexual activity: Not on file  Other Topics Concern  . Not on file  Social History Narrative  . Not on file    FAMILY HISTORY: History reviewed. No pertinent family history.  ALLERGIES:  has No Known Allergies.  MEDICATIONS:  Current Outpatient Medications  Medication Sig Dispense Refill  . apixaban (ELIQUIS) 5 MG TABS tablet 10 mg PO BID X 5 days, then start 5 mg PO BID Thereafter. 90 tablet 1  . aspirin  EC 81 MG tablet Take 81 mg by mouth daily.    . canagliflozin (INVOKANA) 100 MG TABS tablet Take 100 mg by mouth daily before breakfast.    . DULoxetine (CYMBALTA) 60 MG capsule Take 60 mg by mouth every evening.     . naproxen (NAPROSYN) 500 MG tablet Take 1 tablet (500 mg total) by mouth 2 (two) times daily with a meal. 30 tablet 0  . simvastatin (ZOCOR) 40 MG tablet Take 40 mg by mouth daily.    . ziprasidone (GEODON) 80 MG capsule Take 80 mg by mouth at bedtime.    Marland Kitchen zolpidem (AMBIEN) 10 MG tablet Take 10 mg by mouth at bedtime as needed for sleep.     No current facility-administered medications for this visit.       Marland Kitchen  PHYSICAL EXAMINATION: ECOG PERFORMANCE STATUS: 0 - Asymptomatic  Vitals:   12/21/18 1332  BP: 118/77  Pulse: (!) 101  Resp: 16   Filed Weights   12/21/18 1332  Weight: 181 lb 9.6 oz (82.4 kg)    Physical Exam  Constitutional: She is oriented to person, place, and time and well-developed, well-nourished, and in no distress.  Accompanied by daughter.  HENT:  Head: Normocephalic and atraumatic.  Mouth/Throat: Oropharynx is clear and moist. No oropharyngeal exudate.  Eyes: Pupils are equal, round, and reactive to light.  Neck: Normal range of motion. Neck supple.  Cardiovascular: Normal rate and regular rhythm.  Pulmonary/Chest: No respiratory distress. She has no wheezes.  Abdominal: Soft. Bowel sounds are normal. She exhibits no distension and no mass. There is no abdominal tenderness. There is no rebound and no guarding.  Musculoskeletal: Normal range of motion.        General: No tenderness or edema.  Neurological: She is alert and oriented to person, place, and time.  Skin: Skin is warm.  Psychiatric:  Flat affect.     LABORATORY DATA:  I have reviewed the data as listed Lab Results  Component Value Date   WBC 6.6 12/21/2018   HGB 11.6 (L) 12/21/2018   HCT 35.4 (L) 12/21/2018   MCV 82.9 12/21/2018   PLT 357 12/21/2018   Recent Labs     11/30/18 1129 12/01/18 0214 12/02/18 0209 12/21/18 1257  NA 136 136  --  139  K 3.1* 3.1* 3.0* 3.7  CL 97* 97*  --  107  CO2 29 32  --  25  GLUCOSE 164* 142*  --  156*  BUN 11 17  --  13  CREATININE 0.93 1.21*  --  0.96  CALCIUM 9.5 8.7*  --  8.8*  GFRNONAA >60 53*  --  >60  GFRAA >60 >60  --  >60  PROT 8.6*  --   --   --   ALBUMIN 4.2  --   --   --   AST 12*  --   --   --   ALT 10  --   --   --   ALKPHOS 70  --   --   --  BILITOT 0.8  --   --   --   BILIDIR 0.1  --   --   --   IBILI 0.7  --   --   --     RADIOGRAPHIC STUDIES: I have personally reviewed the radiological images as listed and agreed with the findings in the report. Dg Chest 2 View  Result Date: 11/30/2018 CLINICAL DATA:  Chest pain and shortness of breath for 2 days. Sickle cell anemia. Smoker. EXAM: CHEST - 2 VIEW COMPARISON:  None. FINDINGS: Heart size is within normal limits. Elevation of right hemidiaphragm seen. Atelectasis or infiltrate is seen in both lung bases, right side greater than left. IMPRESSION: Bibasilar atelectasis versus infiltrates, right side greater than left. Electronically Signed   By: Myles Rosenthal M.D.   On: 11/30/2018 12:20   Ct Angio Chest Pe W And/or Wo Contrast  Result Date: 11/30/2018 CLINICAL DATA:  Pt states increasing chest pain and SOB. Pt also states RUQ pain worsening over the last day. EXAM: CT ANGIOGRAPHY CHEST CT ABDOMEN AND PELVIS WITH CONTRAST TECHNIQUE: Multidetector CT imaging of the chest was performed using the standard protocol during bolus administration of intravenous contrast. Multiplanar CT image reconstructions and MIPs were obtained to evaluate the vascular anatomy. Multidetector CT imaging of the abdomen and pelvis was performed using the standard protocol during bolus administration of intravenous contrast. CONTRAST:  21mL OMNIPAQUE IOHEXOL 350 MG/ML SOLN COMPARISON:  11/30/2018 FINDINGS: CTA CHEST FINDINGS Cardiovascular: There are large pulmonary emboli involving  the RIGHT main pulmonary artery extending into the UPPER and LOWER lobe arteries. Significant LEFT pulmonary embolus involves the LOWER lobe arteries and extends into LOWER lobe branches. No evidence for RIGHT heart strain. Incidental note is made of LEFT common carotid originating from the RIGHT brachiocephalic artery. Otherwise the thoracic aorta is normal. Mediastinum/Nodes: Small hiatal hernia. Esophagus other wise is normal. Thyroid is normal in appearance. No significant adenopathy. Lungs/Pleura: There is bibasilar atelectasis. Consolidation at the RIGHT LOWER lobe may represent infarct. No suspicious pulmonary nodules. Musculoskeletal: No chest wall abnormality. No acute or significant osseous findings. Review of the MIP images confirms the above findings. CT ABDOMEN and PELVIS FINDINGS Hepatobiliary: No focal liver abnormality is seen. No radiopaque gallstones, biliary dilatation, or pericholecystic inflammatory changes. Pancreas: Unremarkable. No pancreatic ductal dilatation or surrounding inflammatory changes. Spleen: Normal in size without focal abnormality. Adrenals/Urinary Tract: Adrenal glands are normal in appearance. Symmetric enhancement and excretion from both kidneys. No renal mass. Ureters are unremarkable. The bladder and visualized portion of the urethra are normal. Stomach/Bowel: Small hiatal hernia. Small bowel loops are normal in appearance. The appendix is well seen and is normal in caliber. Tiny appendicolith without evidence for acute appendicitis. Large bowel is unremarkable. Vascular/Lymphatic: The RIGHT common femoral artery is expanded and low attenuation, suspicious for deep vein thrombosis. There is slight heterogeneous appearance of the LEFT common femoral vein, possibly related to in flow. Consider thrombosis. No retroperitoneal or mesenteric adenopathy. Reproductive: The uterus is present. No adnexal mass. No free pelvic fluid. Other: No abdominal wall hernia or abnormality. No  abdominopelvic ascites. Musculoskeletal: No acute or significant osseous findings. Review of the MIP images confirms the above findings. IMPRESSION: 1. Significant pulmonary emboli bilaterally. There is no evidence for RIGHT heart strain. 2. Bibasilar atelectasis; suspect consolidation or infarct in the RIGHT LOWER lobe. 3. Small hiatal hernia. 4. Normal appendix. 5. Suspect RIGHT femoral vein thrombosis. Possible LEFT femoral vein thrombosis. Recommend further evaluation with bilateral LOWER extremity Doppler exams. Critical  Value/emergent results were called by telephone at the time of interpretation on 11/30/2018 at 1:10 pm to Dr. Ileana Roup , who verbally acknowledged these results. Electronically Signed   By: Norva Pavlov M.D.   On: 11/30/2018 13:28   Ct Abdomen Pelvis W Contrast  Result Date: 11/30/2018 CLINICAL DATA:  Pt states increasing chest pain and SOB. Pt also states RUQ pain worsening over the last day. EXAM: CT ANGIOGRAPHY CHEST CT ABDOMEN AND PELVIS WITH CONTRAST TECHNIQUE: Multidetector CT imaging of the chest was performed using the standard protocol during bolus administration of intravenous contrast. Multiplanar CT image reconstructions and MIPs were obtained to evaluate the vascular anatomy. Multidetector CT imaging of the abdomen and pelvis was performed using the standard protocol during bolus administration of intravenous contrast. CONTRAST:  75mL OMNIPAQUE IOHEXOL 350 MG/ML SOLN COMPARISON:  11/30/2018 FINDINGS: CTA CHEST FINDINGS Cardiovascular: There are large pulmonary emboli involving the RIGHT main pulmonary artery extending into the UPPER and LOWER lobe arteries. Significant LEFT pulmonary embolus involves the LOWER lobe arteries and extends into LOWER lobe branches. No evidence for RIGHT heart strain. Incidental note is made of LEFT common carotid originating from the RIGHT brachiocephalic artery. Otherwise the thoracic aorta is normal. Mediastinum/Nodes: Small hiatal  hernia. Esophagus other wise is normal. Thyroid is normal in appearance. No significant adenopathy. Lungs/Pleura: There is bibasilar atelectasis. Consolidation at the RIGHT LOWER lobe may represent infarct. No suspicious pulmonary nodules. Musculoskeletal: No chest wall abnormality. No acute or significant osseous findings. Review of the MIP images confirms the above findings. CT ABDOMEN and PELVIS FINDINGS Hepatobiliary: No focal liver abnormality is seen. No radiopaque gallstones, biliary dilatation, or pericholecystic inflammatory changes. Pancreas: Unremarkable. No pancreatic ductal dilatation or surrounding inflammatory changes. Spleen: Normal in size without focal abnormality. Adrenals/Urinary Tract: Adrenal glands are normal in appearance. Symmetric enhancement and excretion from both kidneys. No renal mass. Ureters are unremarkable. The bladder and visualized portion of the urethra are normal. Stomach/Bowel: Small hiatal hernia. Small bowel loops are normal in appearance. The appendix is well seen and is normal in caliber. Tiny appendicolith without evidence for acute appendicitis. Large bowel is unremarkable. Vascular/Lymphatic: The RIGHT common femoral artery is expanded and low attenuation, suspicious for deep vein thrombosis. There is slight heterogeneous appearance of the LEFT common femoral vein, possibly related to in flow. Consider thrombosis. No retroperitoneal or mesenteric adenopathy. Reproductive: The uterus is present. No adnexal mass. No free pelvic fluid. Other: No abdominal wall hernia or abnormality. No abdominopelvic ascites. Musculoskeletal: No acute or significant osseous findings. Review of the MIP images confirms the above findings. IMPRESSION: 1. Significant pulmonary emboli bilaterally. There is no evidence for RIGHT heart strain. 2. Bibasilar atelectasis; suspect consolidation or infarct in the RIGHT LOWER lobe. 3. Small hiatal hernia. 4. Normal appendix. 5. Suspect RIGHT femoral  vein thrombosis. Possible LEFT femoral vein thrombosis. Recommend further evaluation with bilateral LOWER extremity Doppler exams. Critical Value/emergent results were called by telephone at the time of interpretation on 11/30/2018 at 1:10 pm to Dr. Ileana Roup , who verbally acknowledged these results. Electronically Signed   By: Norva Pavlov M.D.   On: 11/30/2018 13:28   US Venous Img Lower Bilateral  Result Date: 11/30/2018 CLINICAL DATA:  Acute pulmonary embolus by chest CTA EXAM: BILATERAL LOWER EXTREMITY VENOUS DOPPLER ULTRASOUND TECHNIQUE: Gray-scale sonography with graded compression, as well as color Doppler and duplex ultrasound were performed to evaluate the lower extremity deep venous systems from the level of the common femoral vein and including the  common femoral, femoral, profunda femoral, popliteal and calf veins including the posterior tibial, peroneal and gastrocnemius veins when visible. The superficial great saphenous vein was also interrogated. Spectral Doppler was utilized to evaluate flow at rest and with distal augmentation maneuvers in the common femoral, femoral and popliteal veins. COMPARISON:  None. FINDINGS: RIGHT LOWER EXTREMITY Common Femoral Vein: No evidence of thrombus. Normal compressibility, respiratory phasicity and response to augmentation. Saphenofemoral Junction: No evidence of thrombus. Normal compressibility and flow on color Doppler imaging. Profunda Femoral Vein: No evidence of thrombus. Normal compressibility and flow on color Doppler imaging. Femoral Vein: No evidence of thrombus. Normal compressibility, respiratory phasicity and response to augmentation. Popliteal Vein: No evidence of thrombus. Normal compressibility, respiratory phasicity and response to augmentation. Calf Veins: No evidence of thrombus. Normal compressibility and flow on color Doppler imaging. Superficial Great Saphenous Vein: No evidence of thrombus. Normal compressibility. Venous Reflux:   None. Other Findings:  None. LEFT LOWER EXTREMITY Common Femoral Vein: No evidence of thrombus. Normal compressibility, respiratory phasicity and response to augmentation. Saphenofemoral Junction: No evidence of thrombus. Normal compressibility and flow on color Doppler imaging. Profunda Femoral Vein: No evidence of thrombus. Normal compressibility and flow on color Doppler imaging. Femoral Vein: No evidence of thrombus. Normal compressibility, respiratory phasicity and response to augmentation. Popliteal Vein: No evidence of thrombus. Normal compressibility, respiratory phasicity and response to augmentation. Calf Veins: No evidence of thrombus. Normal compressibility and flow on color Doppler imaging. Superficial Great Saphenous Vein: No evidence of thrombus. Normal compressibility. Venous Reflux:  None. Other Findings:  None. IMPRESSION: No evidence of deep venous thrombosis. Electronically Signed   By: Judie Petit.  Shick M.D.   On: 11/30/2018 15:29    ASSESSMENT & PLAN:   Pulmonary embolism, bilateral (HCC) # BILATERAL PE--status post thrombolysis.  Unprovoked.  Recommend continued Eliquis 5 mg twice daily.  Hypercoagulable work-up negative so far.  Reviewed the hypercoagulable  #Had a long discussion with patient and family regarding the length of anticoagulation anywhere between 6 months to 1 year.   # Back pain-recommend follow-up with PCP recommend Tylenol as needed.  Avoid NSAIDs.  # 25 minutes face-to-face with the patient discussing the above plan of care; more than 50% of time spent on prognosis/ natural history; counseling and coordination.   # DISPOSITION: # follow up in 3 months- MD/labs- cbc/cmp- Dr.B  Dr.Aycock    All questions were answered. The patient knows to call the clinic with any problems, questions or concerns.    Earna Coder, MD 12/21/2018 1:52 PM

## 2019-03-22 ENCOUNTER — Other Ambulatory Visit: Payer: Self-pay

## 2019-03-22 ENCOUNTER — Inpatient Hospital Stay: Payer: Medicaid Other

## 2019-03-22 ENCOUNTER — Inpatient Hospital Stay: Payer: Medicaid Other | Admitting: Internal Medicine

## 2019-04-19 ENCOUNTER — Inpatient Hospital Stay: Payer: Medicaid Other

## 2019-04-19 ENCOUNTER — Inpatient Hospital Stay: Payer: Medicaid Other | Admitting: Internal Medicine

## 2019-04-19 NOTE — Assessment & Plan Note (Deleted)
#   BILATERAL PE--status post thrombolysis.  Unprovoked.  Recommend continued Eliquis 5 mg twice daily.  Hypercoagulable work-up negative so far.  Reviewed the hypercoagulable  #Had a long discussion with patient and family regarding the length of anticoagulation anywhere between 6 months to 1 year.   # Back pain-recommend follow-up with PCP recommend Tylenol as needed.  Avoid NSAIDs.  # 25 minutes face-to-face with the patient discussing the above plan of care; more than 50% of time spent on prognosis/ natural history; counseling and coordination.   # DISPOSITION: # follow up in 3 months- MD/labs- cbc/cmp- Dr.B  Dr.Aycock

## 2019-04-19 NOTE — Progress Notes (Deleted)
[facility] CONSULT NOTE  [facility] General (General Practice)  CHIEF COMPLAINTS/PURPOSE OF CONSULTATION:  BILATERAL PE  # FEB 2020- BILATERAL PE unprovoked. 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     HISTORY OF PRESENTING ILLNESS:  Diana Harvey 48 y.o.  female was recently admitted to hospital for bilateral PE.  Patient underwent thrombolysis; and discharged home on Eliquis.  Patient denies any blood in stools black or stools.  Complains of left-sided flank pain.  No fevers or chills.  Review of Systems  Constitutional: Negative for chills, diaphoresis, fever, malaise/fatigue and weight loss.  HENT: Negative for nosebleeds and sore throat.   Eyes: Negative for double vision.  Respiratory: Negative for cough, hemoptysis, sputum production, shortness of breath and wheezing.   Cardiovascular: Negative for chest pain, palpitations, orthopnea and leg swelling.  Gastrointestinal: Negative for abdominal pain, blood in stool, constipation, diarrhea, heartburn, melena, nausea and vomiting.  Genitourinary: Negative for dysuria, frequency and urgency.  Musculoskeletal: Positive for back pain and joint pain.  Skin: Negative.  Negative for itching and rash.  Neurological: Negative for dizziness, tingling, focal weakness, weakness and headaches.  Endo/Heme/Allergies: Does not bruise/bleed easily.  Psychiatric/Behavioral: Negative for depression. The patient is not nervous/anxious and does not have insomnia.      MEDICAL HISTORY:  Past Medical History:  Diagnosis Date  . Diabetes mellitus without complication (HCC)   . Sickle cell anemia (HCC)     SURGICAL HISTORY: Past Surgical History:  Procedure Laterality Date  . PULMONARY  THROMBECTOMY Bilateral 12/01/2018   Procedure: BILATERAL PULMONARY THROMBECTOMY WITH POSSIBLE BILATERAL ILEO-FEMORAL VENOUS LYSIS WITH IVC FILTER PLACEMENT;  Surgeon: Renford DillsSchnier, Gregory G, MD;  Location: [facility] INVASIVE CV LAB;  Service: Cardiovascular;  Laterality: Bilateral;    SOCIAL HISTORY: Social History   Socioeconomic History  . Marital status: Single    Spouse name: Not on file  . Number of children: Not on file  . Years of education: Not on file  . Highest education level: Not on file  Occupational History  . Not on file  Social Needs  . Financial resource strain: Not on file  . Food insecurity    Worry: Not on file    Inability: Not on file  . Transportation needs    Medical: Not on file    Non-medical: Not on file  Tobacco Use  . Smoking status: Current Every Day Smoker  . Smokeless tobacco: Never Used  Substance and Sexual Activity  . Alcohol use: No  . Drug use: Not Currently  . Sexual activity: Not Currently  Lifestyle  . Physical activity    Days per week: Not on file    Minutes per session: Not on file  . Stress: Not on file  Relationships  . Social Musicianconnections    Talks on phone: Not on file    Gets together: Not on file    Attends religious service: Not on file    Active member of club or organization: Not on file    Attends meetings of clubs or organizations: Not on file    Relationship status: Not on file  . Intimate partner violence    Fear of current or ex partner: Not on file    Emotionally abused: Not on file    Physically abused: Not on file    Forced sexual activity:  Not on file  Other Topics Concern  . Not on file  Social History Narrative  . Not on file    FAMILY HISTORY: No family history on file.  ALLERGIES:  has No Known Allergies.  MEDICATIONS:  Current Outpatient Medications  Medication Sig Dispense Refill  . apixaban (ELIQUIS) 5 MG TABS tablet 10 mg PO BID X 5 days, then start 5 mg PO BID Thereafter. 90 tablet 1  . aspirin EC  81 MG tablet Take 81 mg by mouth daily.    . canagliflozin (INVOKANA) 100 MG TABS tablet Take 100 mg by mouth daily before breakfast.    . DULoxetine (CYMBALTA) 60 MG capsule Take 60 mg by mouth every evening.     . naproxen (NAPROSYN) 500 MG tablet Take 1 tablet (500 mg total) by mouth 2 (two) times daily with a meal. 30 tablet 0  . simvastatin (ZOCOR) 40 MG tablet Take 40 mg by mouth daily.    . ziprasidone (GEODON) 80 MG capsule Take 80 mg by mouth at bedtime.    Marland Kitchen zolpidem (AMBIEN) 10 MG tablet Take 10 mg by mouth at bedtime as needed for sleep.     No current facility-administered medications for this visit.       Marland Kitchen  PHYSICAL EXAMINATION: ECOG PERFORMANCE STATUS: 0 - Asymptomatic  There were no vitals filed for this visit. There were no vitals filed for this visit.  Physical Exam  Constitutional: She is oriented to person, place, and time and well-developed, well-nourished, and in no distress.  Accompanied by daughter.  HENT:  Head: Normocephalic and atraumatic.  Mouth/Throat: Oropharynx is clear and moist. No oropharyngeal exudate.  Eyes: Pupils are equal, round, and reactive to light.  Neck: Normal range of motion. Neck supple.  Cardiovascular: Normal rate and regular rhythm.  Pulmonary/Chest: No respiratory distress. She has no wheezes.  Abdominal: Soft. Bowel sounds are normal. She exhibits no distension and no mass. There is no abdominal tenderness. There is no rebound and no guarding.  Musculoskeletal: Normal range of motion.        General: No tenderness or edema.  Neurological: She is alert and oriented to person, place, and time.  Skin: Skin is warm.  Psychiatric:  Flat affect.     LABORATORY DATA:  I have reviewed the data as listed Lab Results  Component Value Date   WBC 6.6 12/21/2018   HGB 11.6 (L) 12/21/2018   HCT 35.4 (L) 12/21/2018   MCV 82.9 12/21/2018   PLT 357 12/21/2018   Recent Labs    11/30/18 1129 12/01/18 0214 12/02/18 0209  12/21/18 1257  NA 136 136  --  139  K 3.1* 3.1* 3.0* 3.7  CL 97* 97*  --  107  CO2 29 32  --  25  GLUCOSE 164* 142*  --  156*  BUN 11 17  --  13  CREATININE 0.93 1.21*  --  0.96  CALCIUM 9.5 8.7*  --  8.8*  GFRNONAA >60 53*  --  >60  GFRAA >60 >60  --  >60  PROT 8.6*  --   --   --   ALBUMIN 4.2  --   --   --   AST 12*  --   --   --   ALT 10  --   --   --   ALKPHOS 70  --   --   --   BILITOT 0.8  --   --   --   BILIDIR 0.1  --   --   --  IBILI 0.7  --   --   --     RADIOGRAPHIC STUDIES: I have personally reviewed the radiological images as listed and agreed with the findings in the report. No results found.  ASSESSMENT & PLAN:   No problem-specific Assessment & Plan notes found for this encounter.  All questions were answered. The patient knows to call the clinic with any problems, questions or concerns.    Earna CoderGovinda R , MD 04/19/2019 8:13 AM

## 2019-05-05 ENCOUNTER — Other Ambulatory Visit: Payer: Self-pay

## 2019-05-06 ENCOUNTER — Inpatient Hospital Stay: Payer: Medicaid Other

## 2019-05-06 ENCOUNTER — Inpatient Hospital Stay: Payer: Medicaid Other | Admitting: Internal Medicine

## 2019-10-29 IMAGING — CR DG CHEST 2V
1 series · 2 of 2 positions shown · non-contrast
Comparison: None.

CLINICAL DATA: Chest pain and shortness of breath for 2 days.
Sickle cell anemia. Smoker.

EXAM:
CHEST - 2 VIEW

[Series 1: dg chest 2 view · 0.14mm/px · 2 of 2 slices shown]
[im 1/2]
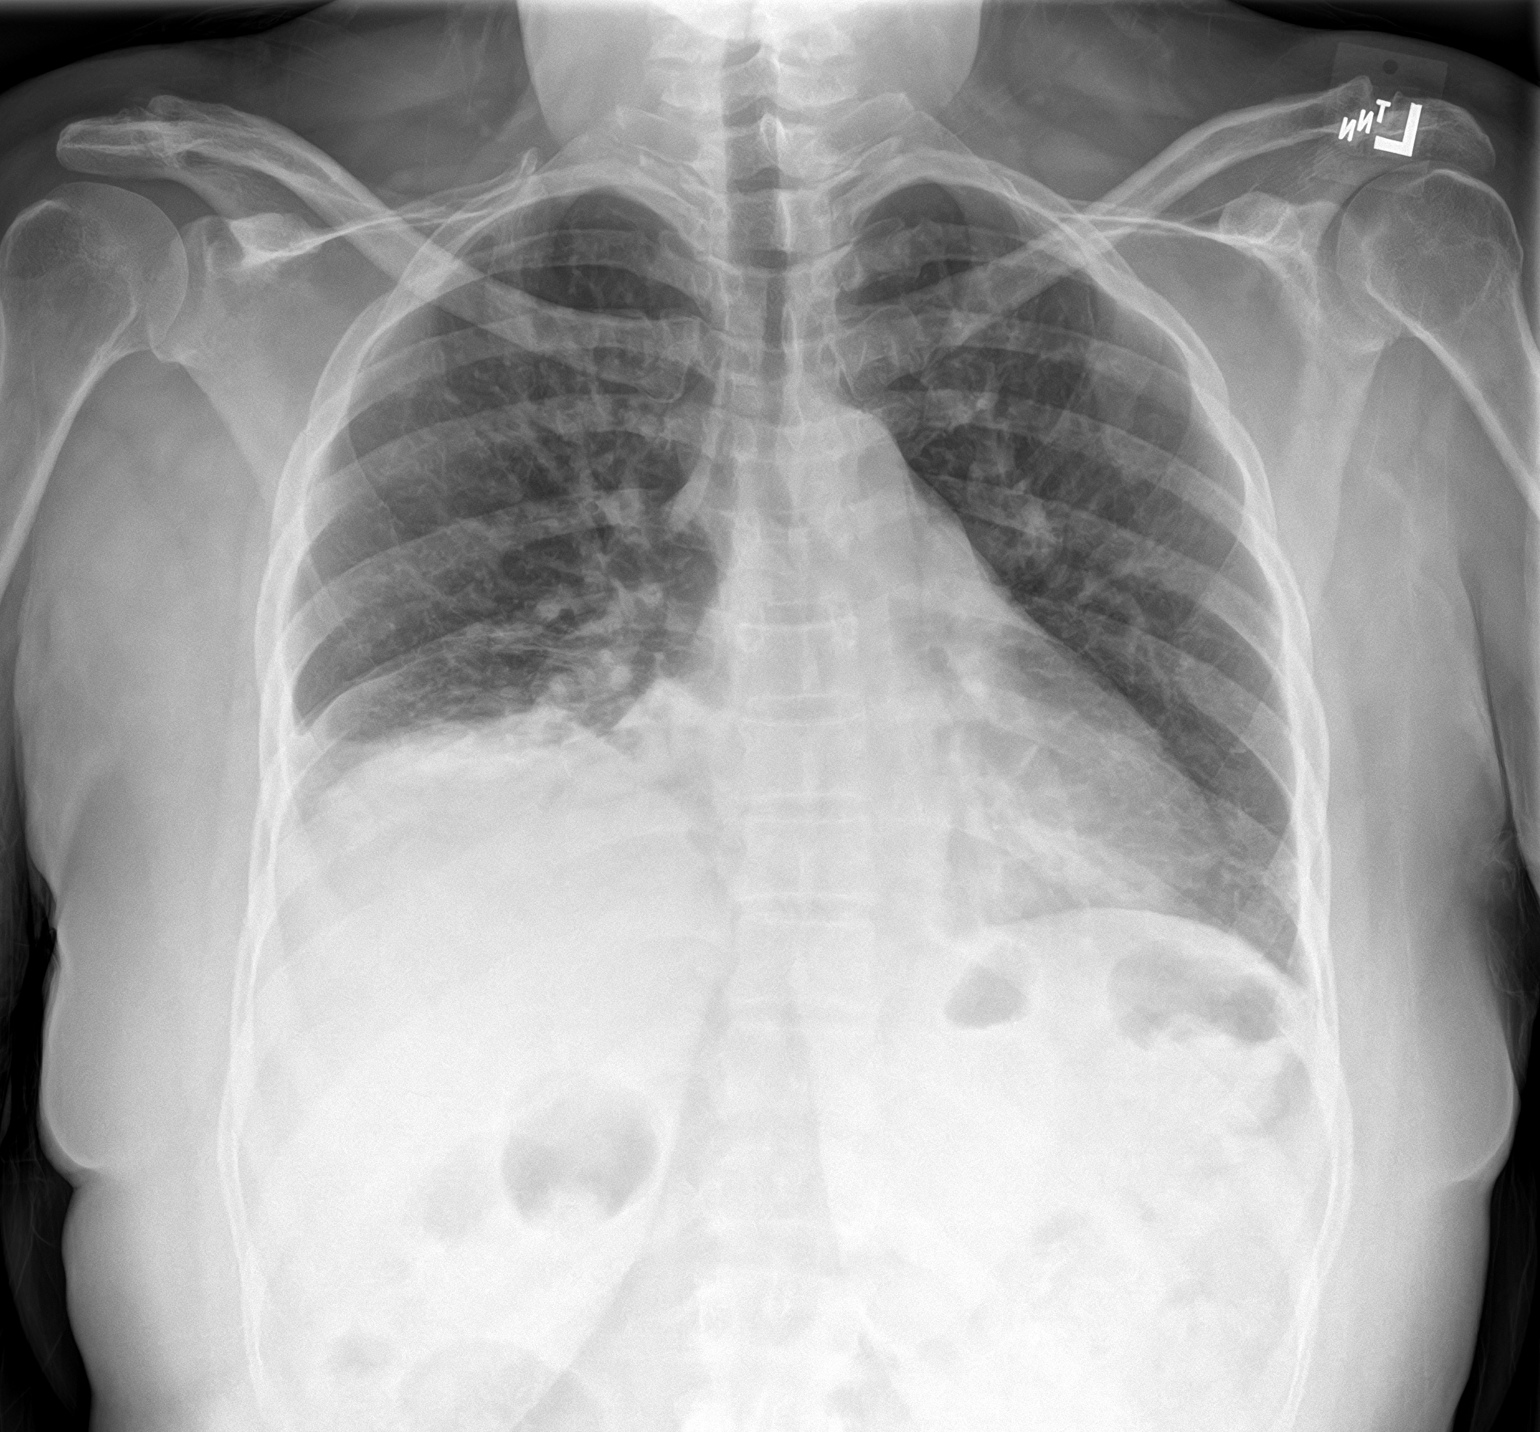
[im 2/2]
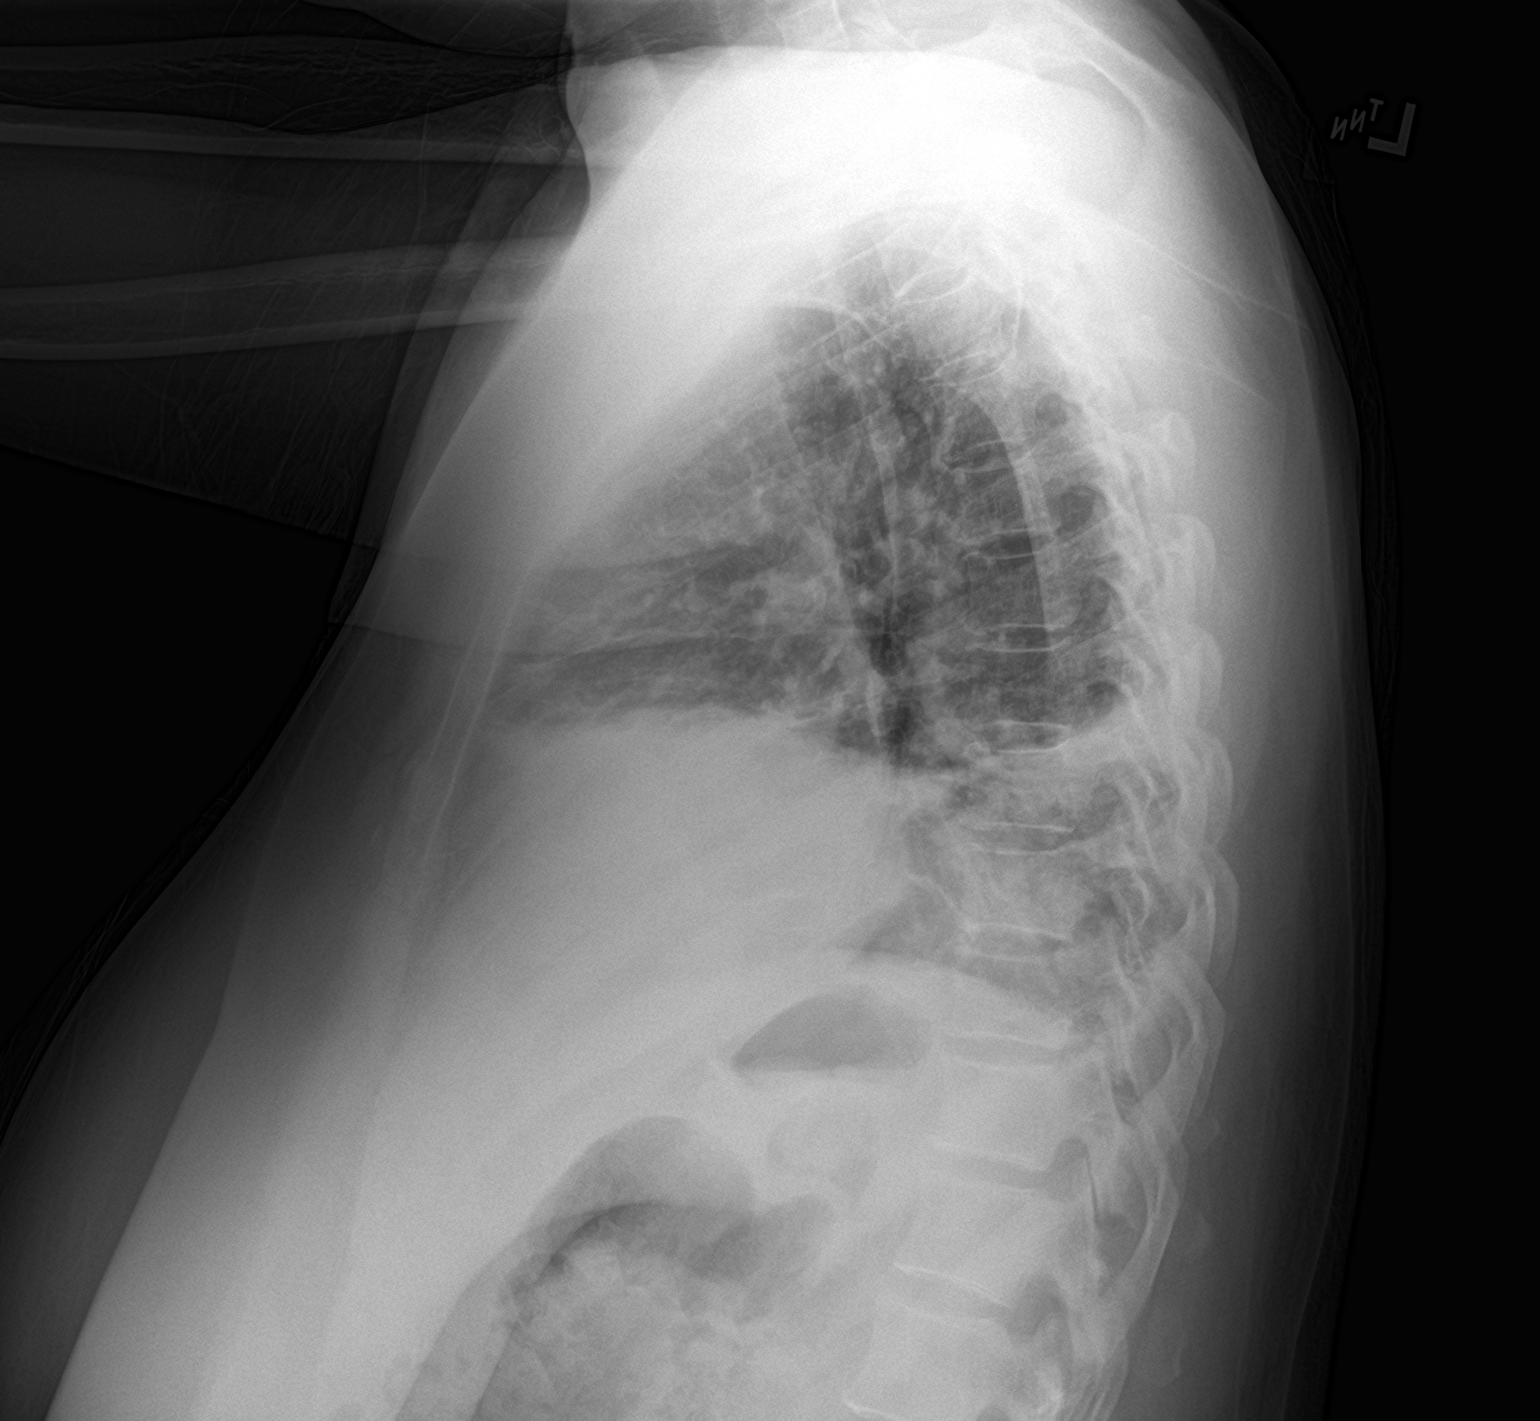

[2 of 2 positions shown; findings below may reference images not displayed]

FINDINGS: Heart size is within normal limits. Elevation of right hemidiaphragm
seen. Atelectasis or infiltrate is seen in both lung bases, right
side greater than left.
IMPRESSION: Bibasilar atelectasis versus infiltrates, right side greater than
left.
